# Patient Record
Sex: Female | Born: 1945 | Race: White | Hispanic: No | Smoking: Never smoker
Health system: Southern US, Community
[De-identification: ages and names within clinical notes are randomized; demographics above are authoritative.]

## PROBLEM LIST (undated history)

## (undated) DIAGNOSIS — I4719 Other supraventricular tachycardia: Secondary | ICD-10-CM

## (undated) DIAGNOSIS — E039 Hypothyroidism, unspecified: Secondary | ICD-10-CM

## (undated) DIAGNOSIS — M1990 Unspecified osteoarthritis, unspecified site: Secondary | ICD-10-CM

## (undated) DIAGNOSIS — I10 Essential (primary) hypertension: Secondary | ICD-10-CM

## (undated) DIAGNOSIS — R001 Bradycardia, unspecified: Secondary | ICD-10-CM

## (undated) DIAGNOSIS — E119 Type 2 diabetes mellitus without complications: Secondary | ICD-10-CM

## (undated) DIAGNOSIS — R002 Palpitations: Secondary | ICD-10-CM

## (undated) DIAGNOSIS — E785 Hyperlipidemia, unspecified: Secondary | ICD-10-CM

## (undated) DIAGNOSIS — R739 Hyperglycemia, unspecified: Secondary | ICD-10-CM

## (undated) DIAGNOSIS — R55 Syncope and collapse: Secondary | ICD-10-CM

## (undated) DIAGNOSIS — Z974 Presence of external hearing-aid: Secondary | ICD-10-CM

## (undated) DIAGNOSIS — M8580 Other specified disorders of bone density and structure, unspecified site: Secondary | ICD-10-CM

## (undated) HISTORY — DX: Hypothyroidism, unspecified: E03.9

## (undated) HISTORY — DX: Other specified disorders of bone density and structure, unspecified site: M85.80

## (undated) HISTORY — DX: Hyperlipidemia, unspecified: E78.5

## (undated) HISTORY — PX: MANDIBLE SURGERY: SHX707

## (undated) HISTORY — DX: Hyperglycemia, unspecified: R73.9

---

## 2004-02-25 ENCOUNTER — Ambulatory Visit (HOSPITAL_COMMUNITY): Admission: RE | Admit: 2004-02-25 | Discharge: 2004-02-25 | Payer: Self-pay | Admitting: Neurosurgery

## 2004-06-29 ENCOUNTER — Ambulatory Visit: Payer: Self-pay | Admitting: Internal Medicine

## 2005-02-02 ENCOUNTER — Ambulatory Visit: Payer: Self-pay | Admitting: Internal Medicine

## 2005-06-19 ENCOUNTER — Ambulatory Visit: Payer: Self-pay | Admitting: Internal Medicine

## 2005-10-09 ENCOUNTER — Ambulatory Visit: Payer: Self-pay | Admitting: Internal Medicine

## 2006-11-21 ENCOUNTER — Ambulatory Visit: Payer: Self-pay | Admitting: Internal Medicine

## 2007-12-02 ENCOUNTER — Ambulatory Visit: Payer: Self-pay | Admitting: Internal Medicine

## 2008-12-02 ENCOUNTER — Ambulatory Visit: Payer: Self-pay | Admitting: Internal Medicine

## 2009-09-20 DIAGNOSIS — D239 Other benign neoplasm of skin, unspecified: Secondary | ICD-10-CM

## 2009-09-20 HISTORY — DX: Other benign neoplasm of skin, unspecified: D23.9

## 2009-12-05 ENCOUNTER — Ambulatory Visit: Payer: Self-pay | Admitting: Internal Medicine

## 2011-02-22 ENCOUNTER — Ambulatory Visit: Payer: Self-pay | Admitting: Internal Medicine

## 2012-02-26 ENCOUNTER — Ambulatory Visit: Payer: Self-pay | Admitting: Internal Medicine

## 2013-03-17 ENCOUNTER — Ambulatory Visit: Payer: Self-pay | Admitting: Internal Medicine

## 2014-04-27 ENCOUNTER — Ambulatory Visit: Payer: Self-pay | Admitting: Internal Medicine

## 2015-06-14 ENCOUNTER — Other Ambulatory Visit: Payer: Self-pay | Admitting: Internal Medicine

## 2015-06-14 DIAGNOSIS — Z1231 Encounter for screening mammogram for malignant neoplasm of breast: Secondary | ICD-10-CM

## 2015-06-23 ENCOUNTER — Ambulatory Visit: Payer: Self-pay

## 2015-06-30 ENCOUNTER — Ambulatory Visit
Admission: RE | Admit: 2015-06-30 | Discharge: 2015-06-30 | Disposition: A | Discharge status: Home / Self Care | Payer: Medicare Other | Source: Ambulatory Visit | Attending: Internal Medicine | Admitting: Internal Medicine

## 2015-06-30 ENCOUNTER — Other Ambulatory Visit: Payer: Self-pay | Admitting: Internal Medicine

## 2015-06-30 DIAGNOSIS — Z1231 Encounter for screening mammogram for malignant neoplasm of breast: Secondary | ICD-10-CM

## 2015-12-05 ENCOUNTER — Ambulatory Visit
Admission: RE | Admit: 2015-12-05 | Discharge: 2015-12-05 | Disposition: A | Discharge status: Home / Self Care | Payer: Medicare Other | Source: Ambulatory Visit | Attending: Internal Medicine | Admitting: Internal Medicine

## 2015-12-05 ENCOUNTER — Other Ambulatory Visit: Payer: Self-pay | Admitting: Internal Medicine

## 2015-12-05 DIAGNOSIS — M19041 Primary osteoarthritis, right hand: Secondary | ICD-10-CM | POA: Insufficient documentation

## 2015-12-05 DIAGNOSIS — M79646 Pain in unspecified finger(s): Secondary | ICD-10-CM | POA: Insufficient documentation

## 2015-12-05 DIAGNOSIS — M79643 Pain in unspecified hand: Secondary | ICD-10-CM

## 2016-06-29 ENCOUNTER — Other Ambulatory Visit: Payer: Self-pay | Admitting: Internal Medicine

## 2016-06-29 DIAGNOSIS — E049 Nontoxic goiter, unspecified: Secondary | ICD-10-CM

## 2016-06-29 DIAGNOSIS — Z1239 Encounter for other screening for malignant neoplasm of breast: Secondary | ICD-10-CM

## 2016-07-04 ENCOUNTER — Ambulatory Visit
Admission: RE | Admit: 2016-07-04 | Discharge: 2016-07-04 | Disposition: A | Discharge status: Home / Self Care | Payer: Medicare Other | Source: Ambulatory Visit | Attending: Internal Medicine | Admitting: Internal Medicine

## 2016-07-04 ENCOUNTER — Telehealth: Payer: Self-pay | Admitting: Gastroenterology

## 2016-07-04 DIAGNOSIS — E049 Nontoxic goiter, unspecified: Secondary | ICD-10-CM | POA: Insufficient documentation

## 2016-07-04 NOTE — Telephone Encounter (Signed)
Please double check his secondary insurance [employer]. If he has it, please add.

## 2016-07-04 NOTE — Telephone Encounter (Signed)
colonoscopy

## 2016-07-05 ENCOUNTER — Other Ambulatory Visit: Payer: Self-pay

## 2016-07-05 NOTE — Telephone Encounter (Signed)
Gastroenterology Pre-Procedure Review  Request Date: 09/02/16 Requesting Physician: Dr. Rosario Jacks  PATIENT REVIEW QUESTIONS: The patient responded to the following health history questions as indicated:    1. Are you having any GI issues? no 2. Do you have a personal history of Polyps? no 3. Do you have a family history of Colon Cancer or Polyps? no 4. Diabetes Mellitus? no 5. Joint replacements in the past 12 months?no 6. Major health problems in the past 3 months?no 7. Any artificial heart valves, MVP, or defibrillator?no    MEDICATIONS & ALLERGIES:    Patient reports the following regarding taking any anticoagulation/antiplatelet therapy:   Plavix, Coumadin, Eliquis, Xarelto, Lovenox, Pradaxa, Brilinta, or Effient? no Aspirin? yes (Heart [facility])  Patient confirms/reports the following medications:  Current Outpatient Prescriptions  Medication Sig Dispense Refill  . aspirin (GOODSENSE ASPIRIN) 325 MG tablet Take by mouth.    Marland Kitchen atorvastatin (LIPITOR) 10 MG tablet Take by mouth.    . metFORMIN (GLUCOPHAGE) 1000 MG tablet Take by mouth.    . metoprolol succinate (TOPROL-XL) 25 MG 24 hr tablet Take by mouth.     No current facility-administered medications for this visit.     Patient confirms/reports the following allergies:  Allergies  Allergen Reactions  . Penicillins     No orders of the defined types were placed in this encounter.   AUTHORIZATION INFORMATION Primary Insurance: 1D#: Group #:  Secondary Insurance: 1D#: Group #:  SCHEDULE INFORMATION: Date: 08/23/2016 Time: Location: [facility]

## 2016-07-05 NOTE — Telephone Encounter (Signed)
Z12.11 Screening Colonoscopy  08/23/16 [facility] U[employer]/Medicare  Please pre cert

## 2016-07-11 NOTE — Telephone Encounter (Signed)
Authorization is not required per [facility]. At [phone] (redirected to another number)

## 2016-07-13 ENCOUNTER — Ambulatory Visit
Admission: RE | Admit: 2016-07-13 | Discharge: 2016-07-13 | Disposition: A | Discharge status: Home / Self Care | Payer: Medicare Other | Source: Ambulatory Visit | Attending: Internal Medicine | Admitting: Internal Medicine

## 2016-07-13 DIAGNOSIS — Z1239 Encounter for other screening for malignant neoplasm of breast: Secondary | ICD-10-CM

## 2016-07-13 DIAGNOSIS — N6320 Unspecified lump in the left breast, unspecified quadrant: Secondary | ICD-10-CM | POA: Insufficient documentation

## 2016-08-16 NOTE — Discharge Instructions (Signed)

## 2016-08-23 ENCOUNTER — Ambulatory Visit: Payer: Medicare Other | Admitting: Anesthesiology

## 2016-08-23 ENCOUNTER — Encounter
Admission: RE | Disposition: A | Discharge status: Home / Self Care | Payer: Self-pay | Source: Ambulatory Visit | Attending: Gastroenterology

## 2016-08-23 ENCOUNTER — Ambulatory Visit
Admission: RE | Admit: 2016-08-23 | Discharge: 2016-08-23 | Disposition: A | Discharge status: Home / Self Care | Payer: Medicare Other | Source: Ambulatory Visit | Attending: Gastroenterology | Admitting: Gastroenterology

## 2016-08-23 DIAGNOSIS — K5730 Diverticulosis of large intestine without perforation or abscess without bleeding: Secondary | ICD-10-CM | POA: Insufficient documentation

## 2016-08-23 DIAGNOSIS — Z7984 Long term (current) use of oral hypoglycemic drugs: Secondary | ICD-10-CM | POA: Insufficient documentation

## 2016-08-23 DIAGNOSIS — Z1211 Encounter for screening for malignant neoplasm of colon: Secondary | ICD-10-CM | POA: Diagnosis not present

## 2016-08-23 DIAGNOSIS — Z79899 Other long term (current) drug therapy: Secondary | ICD-10-CM | POA: Insufficient documentation

## 2016-08-23 DIAGNOSIS — D125 Benign neoplasm of sigmoid colon: Secondary | ICD-10-CM | POA: Diagnosis not present

## 2016-08-23 DIAGNOSIS — Z7982 Long term (current) use of aspirin: Secondary | ICD-10-CM | POA: Diagnosis not present

## 2016-08-23 DIAGNOSIS — I10 Essential (primary) hypertension: Secondary | ICD-10-CM | POA: Insufficient documentation

## 2016-08-23 DIAGNOSIS — E119 Type 2 diabetes mellitus without complications: Secondary | ICD-10-CM | POA: Diagnosis not present

## 2016-08-23 DIAGNOSIS — K635 Polyp of colon: Secondary | ICD-10-CM

## 2016-08-23 DIAGNOSIS — K641 Second degree hemorrhoids: Secondary | ICD-10-CM | POA: Insufficient documentation

## 2016-08-23 HISTORY — DX: Type 2 diabetes mellitus without complications: E11.9

## 2016-08-23 HISTORY — PX: POLYPECTOMY: SHX5525

## 2016-08-23 HISTORY — DX: Palpitations: R00.2

## 2016-08-23 HISTORY — DX: Unspecified osteoarthritis, unspecified site: M19.90

## 2016-08-23 HISTORY — PX: COLONOSCOPY WITH PROPOFOL: SHX5780

## 2016-08-23 HISTORY — DX: Essential (primary) hypertension: I10

## 2016-08-23 LAB — GLUCOSE, CAPILLARY: Glucose-Capillary: 108 mg/dL — ABNORMAL HIGH (ref 65–99)

## 2016-08-23 SURGERY — COLONOSCOPY WITH PROPOFOL
Anesthesia: Monitor Anesthesia Care | Site: Rectum | Wound class: Contaminated

## 2016-08-23 MED ORDER — LIDOCAINE HCL (CARDIAC) 20 MG/ML IV SOLN
INTRAVENOUS | Status: DC | PRN
  Administered 2016-08-23: 50 mg via INTRAVENOUS
  Administered 2016-08-23: 500 mg via INTRAVENOUS

## 2016-08-23 MED ORDER — STERILE WATER FOR IRRIGATION IR SOLN
Status: DC | PRN
  Administered 2016-08-23: 08:00:00

## 2016-08-23 MED ORDER — LACTATED RINGERS IV SOLN
INTRAVENOUS | Status: DC
  Administered 2016-08-23: 08:00:00 via INTRAVENOUS

## 2016-08-23 MED ORDER — PROPOFOL 10 MG/ML IV BOLUS
INTRAVENOUS | Status: DC | PRN
  Administered 2016-08-23: 20 mg via INTRAVENOUS
  Administered 2016-08-23: 100 mg via INTRAVENOUS
  Administered 2016-08-23 (×3): 20 mg via INTRAVENOUS
  Administered 2016-08-23: 30 mg via INTRAVENOUS
  Administered 2016-08-23: 20 mg via INTRAVENOUS

## 2016-08-23 SURGICAL SUPPLY — 23 items
CANISTER SUCT 1200ML W/VALVE (MISCELLANEOUS) ×3 IMPLANT
CLIP HMST 235XBRD CATH ROT (MISCELLANEOUS) IMPLANT
CLIP RESOLUTION 360 11X235 (MISCELLANEOUS)
FCP ESCP3.2XJMB 240X2.8X (MISCELLANEOUS)
FORCEPS BIOP RAD 4 LRG CAP 4 (CUTTING FORCEPS) IMPLANT
FORCEPS BIOP RJ4 240 W/NDL (MISCELLANEOUS)
FORCEPS ESCP3.2XJMB 240X2.8X (MISCELLANEOUS) IMPLANT
GOWN CVR UNV OPN BCK APRN NK (MISCELLANEOUS) ×2 IMPLANT
GOWN ISOL THUMB LOOP REG UNIV (MISCELLANEOUS) ×6
INJECTOR VARIJECT VIN23 (MISCELLANEOUS) IMPLANT
KIT DEFENDO VALVE AND CONN (KITS) IMPLANT
KIT ENDO PROCEDURE OLY (KITS) ×3 IMPLANT
MARKER SPOT ENDO TATTOO 5ML (MISCELLANEOUS) IMPLANT
PAD GROUND ADULT SPLIT (MISCELLANEOUS) IMPLANT
PROBE APC STR FIRE (PROBE) IMPLANT
RETRIEVER NET ROTH 2.5X230 LF (MISCELLANEOUS) IMPLANT
SNARE SHORT THROW 13M SML OVAL (MISCELLANEOUS) ×2 IMPLANT
SNARE SHORT THROW 30M LRG OVAL (MISCELLANEOUS) IMPLANT
SNARE SNG USE RND 15MM (INSTRUMENTS) IMPLANT
SPOT EX ENDOSCOPIC TATTOO (MISCELLANEOUS)
TRAP ETRAP POLY (MISCELLANEOUS) ×2 IMPLANT
VARIJECT INJECTOR VIN23 (MISCELLANEOUS)
WATER STERILE IRR 250ML POUR (IV SOLUTION) ×3 IMPLANT

## 2016-08-23 NOTE — Op Note (Signed)
[facility] Gastroenterology Patient Name: Francyne Kazan Procedure Date: 08/23/2016 7:56 AM MRN: [id number] Account #: [id number] Date of Birth: Oct 21, 1945 Admit Type: Outpatient Age: 70 Room: [facility] OR ROOM 01 Gender: Female Note Status: Finalized Procedure:            Colonoscopy Indications:          Screening for colorectal malignant neoplasm Providers:            Lucilla Lame MD, MD Referring MD:         Casilda Carls, MD (Referring MD) Medicines:            Propofol per Anesthesia Complications:        No immediate complications. Procedure:            Pre-Anesthesia Assessment:                       - Prior to the procedure, a History and Physical was                        performed, and patient medications and allergies were                        reviewed. The patient's tolerance of previous                        anesthesia was also reviewed. The risks and benefits of                        the procedure and the sedation options and risks were                        discussed with the patient. All questions were                        answered, and informed consent was obtained. Prior                        Anticoagulants: The patient has taken no previous                        anticoagulant or antiplatelet agents. ASA Grade                        Assessment: II - A patient with mild systemic disease.                        After reviewing the risks and benefits, the patient was                        deemed in satisfactory condition to undergo the                        procedure.                       After obtaining informed consent, the colonoscope was                        passed under direct vision. Throughout the procedure,  the patient's blood pressure, pulse, and oxygen                        saturations were monitored continuously. The Olympus                        CF-HQ190L Colonoscope (S#. [id number]) was introduced                  through the anus and advanced to the the cecum,                        identified by appendiceal orifice and ileocecal valve.                        The colonoscopy was performed without difficulty. The                        patient tolerated the procedure well. The quality of                        the bowel preparation was excellent. Findings:      The perianal and digital rectal examinations were normal.      A 8 mm polyp was found in the sigmoid colon. The polyp was pedunculated.       The polyp was removed with a cold snare. Resection and retrieval were       complete.      Multiple small-mouthed diverticula were found in the sigmoid colon.      Non-bleeding internal hemorrhoids were found during retroflexion. The       hemorrhoids were Grade II (internal hemorrhoids that prolapse but reduce       spontaneously). Impression:           - One 8 mm polyp in the sigmoid colon, removed with a                        cold snare. Resected and retrieved.                       - Diverticulosis in the sigmoid colon.                       - Non-bleeding internal hemorrhoids. Recommendation:       - Discharge patient to home.                       - Resume previous diet.                       - Continue present medications.                       - Await pathology results.                       - Repeat colonoscopy in 5 years if polyp adenoma and 10                        years if hyperplastic Procedure Code(s):    --- Professional ---  45385, Colonoscopy, flexible; with removal of tumor(s),                        polyp(s), or other lesion(s) by snare technique Diagnosis Code(s):    --- Professional ---                       Z12.11, Encounter for screening for malignant neoplasm                        of colon                       D12.5, Benign neoplasm of sigmoid colon CPT copyright 2016 American Medical Association. All rights reserved. The codes  documented in this report are preliminary and upon coder review may  be revised to meet current compliance requirements. Lucilla Lame MD, MD 08/23/2016 8:20:39 AM This report has been signed electronically. Number of Addenda: 0 Note Initiated On: 08/23/2016 7:56 AM Scope Withdrawal Time: 0 hours 9 minutes 58 seconds  Total Procedure Duration: 0 hours 13 minutes 32 seconds       [facility]

## 2016-08-23 NOTE — Anesthesia Procedure Notes (Addendum)
Procedure Name: MAC Date/Time: 08/23/2016 8:01 AM Performed by: Cameron Ali Pre-anesthesia Checklist: Patient identified, Emergency Drugs available, Suction available, Timeout performed and Patient being monitored Patient Re-evaluated:Patient Re-evaluated prior to inductionOxygen Delivery Method: Nasal cannula Placement Confirmation: positive ETCO2

## 2016-08-23 NOTE — Anesthesia Postprocedure Evaluation (Signed)
Anesthesia Post Note  Patient: Gus Rankin  Procedure(s) Performed: Procedure(s) (LRB): COLONOSCOPY WITH PROPOFOL (N/A) POLYPECTOMY (N/A)  Patient location during evaluation: PACU Anesthesia Type: MAC Level of consciousness: awake and alert and oriented Pain management: satisfactory to patient Vital Signs Assessment: post-procedure vital signs reviewed and stable Respiratory status: spontaneous breathing, nonlabored ventilation and respiratory function stable Cardiovascular status: blood pressure returned to baseline and stable Postop Assessment: Adequate PO intake and No signs of nausea or vomiting Anesthetic complications: no    Raliegh Ip

## 2016-08-23 NOTE — Anesthesia Preprocedure Evaluation (Signed)
Anesthesia Evaluation  Patient identified by MRN, date of birth, ID band Patient awake    Reviewed: Allergy & Precautions, H&P , NPO status , Patient's Chart, lab work & pertinent test results  Airway Mallampati: II  TM Distance: >3 FB Neck ROM: full    Dental no notable dental hx.    Pulmonary    Pulmonary exam normal        Cardiovascular hypertension, Normal cardiovascular exam     Neuro/Psych    GI/Hepatic   Endo/Other  diabetes  Renal/GU      Musculoskeletal   Abdominal   Peds  Hematology   Anesthesia Other Findings   Reproductive/Obstetrics                             Anesthesia Physical Anesthesia Plan  ASA: II  Anesthesia Plan: MAC   Post-op Pain Management:    Induction:   Airway Management Planned:   Additional Equipment:   Intra-op Plan:   Post-operative Plan:   Informed Consent: I have reviewed the patients History and Physical, chart, labs and discussed the procedure including the risks, benefits and alternatives for the proposed anesthesia with the patient or authorized representative who has indicated his/her understanding and acceptance.     Plan Discussed with:   Anesthesia Plan Comments:         Anesthesia Quick Evaluation  

## 2016-08-23 NOTE — Transfer of Care (Signed)
Immediate Anesthesia Transfer of Care Note  Patient: Whitney Bowers  Procedure(s) Performed: Procedure(s) with comments: COLONOSCOPY WITH PROPOFOL (N/A) - diabetic  POLYPECTOMY (N/A)  Patient Location: PACU  Anesthesia Type: MAC  Level of Consciousness: awake, alert  and patient cooperative  Airway and Oxygen Therapy: Patient Spontanous Breathing and Patient connected to supplemental oxygen  Post-op Assessment: Post-op Vital signs reviewed, Patient's Cardiovascular Status Stable, Respiratory Function Stable, Patent Airway and No signs of Nausea or vomiting  Post-op Vital Signs: Reviewed and stable  Complications: No apparent anesthesia complications

## 2016-08-23 NOTE — H&P (Signed)
  Lucilla Lame, MD [facility] [address] [place], [place] [address] Phone: [phone] Fax : [phone]  Primary Care Physician:  Casilda Carls Primary Gastroenterologist:  Dr. Allen Norris  Pre-Procedure History & Physical: HPI:  Whitney Bowers is a 70 y.o. female is here for a screening colonoscopy.   Past Medical History:  Diagnosis Date  . Arthritis   . Diabetes mellitus without complication ([place])   . Hypertension   . Palpitations     Past Surgical History:  Procedure Laterality Date  . CESAREAN SECTION    . MANDIBLE SURGERY      Prior to Admission medications   Medication Sig Start Date End Date Taking? Authorizing Provider  metFORMIN (GLUCOPHAGE) 1000 MG tablet Take by mouth.   Yes Historical Provider, MD  metoprolol succinate (TOPROL-XL) 25 MG 24 hr tablet Take by mouth.   Yes Historical Provider, MD  aspirin (GOODSENSE ASPIRIN) 325 MG tablet Take by mouth.    Historical Provider, MD  atorvastatin (LIPITOR) 10 MG tablet Take by mouth.    Historical Provider, MD    Allergies as of 07/05/2016 - Review Complete 07/05/2016  Allergen Reaction Noted  . Penicillins  02/16/2016    Family History  Problem Relation Age of Onset  . Breast cancer Maternal Aunt 60    Social History   Social History  . Marital status: Widowed    Spouse name: N/A  . Number of children: N/A  . Years of education: N/A   Occupational History  . Not on file.   Social History Main Topics  . Smoking status: Never Smoker  . Smokeless tobacco: Never Used  . Alcohol use 4.2 oz/week    7 Glasses of wine per week  . Drug use: No  . Sexual activity: Not on file   Other Topics Concern  . Not on file   Social History Narrative  . No narrative on file    Review of Systems: See HPI, otherwise negative ROS  Physical Exam: BP 127/81   Pulse (!) 57   Temp 98 F (36.7 C) (Tympanic)   Resp 16   Ht 5\' 8"  (1.727 m)   Wt 137 lb (62.1 kg)   SpO2 99%   BMI 20.83 kg/m  General:    Alert,  pleasant and cooperative in NAD Head:  Normocephalic and atraumatic. Neck:  Supple; no masses or thyromegaly. Lungs:  Clear throughout to auscultation.    Heart:  Regular rate and rhythm. Abdomen:  Soft, nontender and nondistended. Normal bowel sounds, without guarding, and without rebound.   Neurologic:  Alert and  oriented x4;  grossly normal neurologically.  Impression/Plan: Whitney Bowers is now here to undergo a screening colonoscopy.  Risks, benefits, and alternatives regarding colonoscopy have been reviewed with the patient.  Questions have been answered.  All parties agreeable.

## 2016-08-24 ENCOUNTER — Encounter: Payer: Self-pay | Admitting: Gastroenterology

## 2016-08-28 ENCOUNTER — Encounter: Payer: Self-pay | Admitting: Gastroenterology

## 2016-08-30 ENCOUNTER — Encounter: Payer: Self-pay | Admitting: Gastroenterology

## 2017-08-02 ENCOUNTER — Other Ambulatory Visit: Payer: Self-pay | Admitting: Internal Medicine

## 2017-08-02 DIAGNOSIS — Z1231 Encounter for screening mammogram for malignant neoplasm of breast: Secondary | ICD-10-CM

## 2017-08-22 ENCOUNTER — Ambulatory Visit
Admission: RE | Admit: 2017-08-22 | Discharge: 2017-08-22 | Disposition: A | Discharge status: Home / Self Care | Payer: Medicare Other | Source: Ambulatory Visit | Attending: Internal Medicine | Admitting: Internal Medicine

## 2017-08-22 ENCOUNTER — Other Ambulatory Visit: Payer: Self-pay | Admitting: Internal Medicine

## 2017-08-22 DIAGNOSIS — Z1231 Encounter for screening mammogram for malignant neoplasm of breast: Secondary | ICD-10-CM

## 2018-07-09 ENCOUNTER — Telehealth: Payer: Self-pay | Admitting: Internal Medicine

## 2018-07-09 ENCOUNTER — Telehealth: Payer: Self-pay | Admitting: *Deleted

## 2018-07-09 NOTE — Telephone Encounter (Signed)
Dr. Guerry Bruin office calling States that lab information requested should be sent to Dr. Laurelyn Sickle office at [facility]

## 2018-07-09 NOTE — Telephone Encounter (Signed)
I contacted pt to verify testing and where pt had cardiac testing done.  Pt mentioned that she went through [place] to have all testing done. He ordered the testing and Humphrey Rolls performed but pt never saw Humphrey Rolls in his office.  Pt said "I wore an event monitor for 1 month and it was one of the worse month's I ever had because I had many episodes" She said "Dr.Khan's office only reported one episode to Dr. Guerry Bruin office and she is aware that she had several episodes" Pt explained that there was poor communication between offices and her results. Pt was told she was following up with Dr. Humphrey Rolls and she told Dr. Rosario Jacks she was not going to see Humphrey Rolls. She was scheduled to see Dr. Caryl Comes for EP.  Pt had a stress test done at Dr. Laurelyn Sickle office and event monitor ordered and received by mail.   Pt couldn't remember the name of monitor she wore but knows she wore one. Pt mentioned she called [facility] office to figure out what was going on and she mentioned that she is fine with being rescheduled if records are not obtain by her visit tomorrow.  Pt was highly upset with Dr. Guerry Bruin office for not being able to provide results for her visit and mentioned that she had a lot of concerns concerning her results and poor communication with Khan's office.   Ivin Booty contacted Dr. Guerry Bruin office to request records and spoke directly to [facility] and he mentioned to obtain records from [place] office and that he wasn't the one who referred the pt to our office and that Humphrey Rolls was responsible for providing [place] with records.

## 2018-07-10 ENCOUNTER — Other Ambulatory Visit: Payer: Self-pay | Admitting: *Deleted

## 2018-07-10 ENCOUNTER — Ambulatory Visit (INDEPENDENT_AMBULATORY_CARE_PROVIDER_SITE_OTHER): Payer: Medicare Other

## 2018-07-10 ENCOUNTER — Encounter: Payer: Self-pay | Admitting: *Deleted

## 2018-07-10 ENCOUNTER — Ambulatory Visit: Payer: Medicare Other | Admitting: Internal Medicine

## 2018-07-10 VITALS — BP 116/73 | HR 55 | Ht 68.0 in | Wt 140.2 lb

## 2018-07-10 DIAGNOSIS — R002 Palpitations: Secondary | ICD-10-CM

## 2018-07-10 NOTE — Progress Notes (Signed)
ELECTROPHYSIOLOGY CONSULT NOTE  Patient ID: Whitney Bowers, MRN: [id number], DOB/AGE: Dec 25, 1945 72 y.o. Admit date: (Not on file) Date of Consult: 07/10/2018  Primary Physician: Casilda Carls, MD     Whitney Bowers is a 72 y.o. female who is being seen today for the evaluation of spells at the request of Dr Rosario Jacks.    HPI Whitney Bowers is a 73 y.o. female with a 3 yr hx of abrupt onset offset tachypalpitations associated with presyncope and on one occasion syncope.  Duration is seconds.  No prodrome. Recovery symptoms are really relatively brief.  She was able to stand up off the floor after just about 30 seconds.  These episodes occur frequently.  Often multiple times a day.  She was given an event recorder earlier this summer during which she had multiple episodes.  No activation recordings were made; there was one episode of a nonsustained atrial tachycardia..  Quite fit.  Working out in [employer] and doing Pilates.  Symptoms not correlated with specific activities.  Has eliminated caffeine without benefit.  Acknowledges alcohol 1-2 glasses of wine per day Past Medical History:  Diagnosis Date  . Arthritis   . Diabetes mellitus without complication ([place])   . Hyperglycemia   . Hyperlipidemia   . Hypertension   . Hypothyroidism   . Osteopenia   . Palpitations       Surgical History:  Past Surgical History:  Procedure Laterality Date  . CESAREAN SECTION    . COLONOSCOPY WITH PROPOFOL N/A 08/23/2016   Procedure: COLONOSCOPY WITH PROPOFOL;  Surgeon: Lucilla Lame, MD;  Location: [place];  Service: Endoscopy;  Laterality: N/A;  diabetic   . MANDIBLE SURGERY    . POLYPECTOMY N/A 08/23/2016   Procedure: POLYPECTOMY;  Surgeon: Lucilla Lame, MD;  Location: [place];  Service: Endoscopy;  Laterality: N/A;     Home Meds: Current Meds  Medication Sig  . aspirin (GOODSENSE ASPIRIN) 325 MG tablet Take 325 mg by mouth daily.   Marland Kitchen atorvastatin (LIPITOR) 10  MG tablet Take 10 mg by mouth daily.   Marland Kitchen levothyroxine (SYNTHROID, LEVOTHROID) 50 MCG tablet Take 50 mcg by mouth daily.   . metFORMIN (GLUCOPHAGE) 500 MG tablet Takes 2 tablets am and 1 tablet pm daily.  . metoprolol tartrate (LOPRESSOR) 25 MG tablet Take 25 mg by mouth 2 (two) times daily.    Allergies:  Allergies  Allergen Reactions  . Penicillins     Social History   Socioeconomic History  . Marital status: Widowed    Spouse name: Not on file  . Number of children: Not on file  . Years of education: Not on file  . Highest education level: Not on file  Occupational History  . Not on file  Social Needs  . Financial resource strain: Not on file  . Food insecurity:    Worry: Not on file    Inability: Not on file  . Transportation needs:    Medical: Not on file    Non-medical: Not on file  Tobacco Use  . Smoking status: Never Smoker  . Smokeless tobacco: Never Used  Substance and Sexual Activity  . Alcohol use: Yes    Alcohol/week: 7.0 standard drinks    Types: 7 Glasses of wine per week    Comment: wine  . Drug use: No  . Sexual activity: Not on file  Lifestyle  . Physical activity:    Days per week: Not on file  Minutes per session: Not on file  . Stress: Not on file  Relationships  . Social connections:    Talks on phone: Not on file    Gets together: Not on file    Attends religious service: Not on file    Active member of club or organization: Not on file    Attends meetings of clubs or organizations: Not on file    Relationship status: Not on file  . Intimate partner violence:    Fear of current or ex partner: Not on file    Emotionally abused: Not on file    Physically abused: Not on file    Forced sexual activity: Not on file  Other Topics Concern  . Not on file  Social History Narrative  . Not on file     Family History  Problem Relation Age of Onset  . Breast cancer Maternal Aunt 60  . Hypertension Father   . Stroke Father      ROS:   Please see the history of present illness.     All other systems reviewed and negative.    Physical Exam: Blood pressure 116/73, pulse (!) 55, height 5\' 8"  (1.727 m), weight 140 lb 4 oz (63.6 kg). General: Well developed, well nourished female in no acute distress. Head: Normocephalic, atraumatic, sclera non-icteric, no xanthomas, nares are without discharge. EENT: normal  Lymph Nodes:  none Neck: Negative for carotid bruits. JVD not elevated. Back:without scoliosis kyphosis Lungs: Clear bilaterally to auscultation without wheezes, rales, or rhonchi. Breathing is unlabored. Heart: RRR with S1 S2. No  murmur . No rubs, or gallops appreciated. Abdomen: Soft, non-tender, non-distended with normoactive bowel sounds. No hepatomegaly. No rebound/guarding. No obvious abdominal masses. Msk:  Strength and tone appear normal for age. Extremities: No clubbing or cyanosis. No edema.  Distal pedal pulses are 2+ and equal bilaterally. Skin: Warm and Dry Neuro: Alert and oriented X 3. CN III-XII intact Grossly normal sensory and motor function . Psych:  Responds to questions appropriately with a normal affect.      Labs: Cardiac Enzymes No results for input(s): CKTOTAL, CKMB, TROPONINI in the last 72 hours. CBC No results found for: WBC, HGB, HCT, MCV, PLT PROTIME: No results for input(s): LABPROT, INR in the last 72 hours. Chemistry No results for input(s): NA, K, CL, CO2, BUN, CREATININE, CALCIUM, PROT, BILITOT, ALKPHOS, ALT, AST, GLUCOSE in the last 168 hours.  Invalid input(s): LABALBU Lipids No results found for: CHOL, HDL, LDLCALC, TRIG BNP No results found for: PROBNP Thyroid Function Tests: No results for input(s): TSH, T4TOTAL, T3FREE, THYROIDAB in the last 72 hours.  Invalid input(s): FREET3 Miscellaneous No results found for: DDIMER  Radiology/Studies:  No results found.  EKG: Sinus at 55% intervals 23/10/41   Assessment and Plan:  Presyncope  Atrial  tachycardia-nonsustained  Sinus bradycardia with first-degree AV block   The patient has had presyncopal episodes.  The onset.  They are associated with palpitations.  Interestingly, the event recorder that she had earlier this summer recorded only one episode of nonsustained atrial tachycardia.  This raises significant questions as to the mechanism of her palpitations.  We have discussed the use of a ZIO patch.  This would allow for patient activation to try to identify an arrhythmic trigger.  If in fact we saw atrial tachycardia flecainide might be an appropriate therapeutic intervention.  More than 50% of 60  min was spent in counseling related to the above   Virl Axe

## 2018-07-10 NOTE — Telephone Encounter (Signed)
Notes received

## 2018-07-10 NOTE — Patient Instructions (Signed)
Medication Instructions:  - Your physician recommends that you continue on your current medications as directed. Please refer to the Current Medication list given to you today.  If you need a refill on your cardiac medications before your next appointment, please call your pharmacy.   Lab work: - none ordered  If you have labs (blood work) drawn today and your tests are completely normal, you will receive your results only by: Marland Kitchen MyChart Message (if you have MyChart) OR . A paper copy in the mail If you have any lab test that is abnormal or we need to change your treatment, we will call you to review the results.  Testing/Procedures: - Your physician has recommended that you wear a 14 day heart monitor (ZIO patch).  Follow-Up: At [facility], you and your health needs are our priority.  As part of our continuing mission to provide you with exceptional heart care, we have created designated Provider Care Teams.  These Care Teams include your primary Cardiologist (physician) and Advanced Practice Providers (APPs -  Physician Assistants and Nurse Practitioners) who all work together to provide you with the care you need, when you need it. . in 3-4 weeks with Dr. Caryl Comes  Any Other Special Instructions Will Be Listed Below (If Applicable). - N/A

## 2018-07-24 DIAGNOSIS — R002 Palpitations: Secondary | ICD-10-CM | POA: Diagnosis not present

## 2018-07-31 ENCOUNTER — Ambulatory Visit: Payer: Medicare Other | Admitting: Internal Medicine

## 2018-07-31 ENCOUNTER — Encounter: Payer: Self-pay | Admitting: Internal Medicine

## 2018-07-31 VITALS — BP 130/66 | HR 58 | Ht 68.0 in | Wt 142.0 lb

## 2018-07-31 NOTE — Progress Notes (Signed)
      Patient Care Team: Casilda Carls, MD as PCP - General (Internal Medicine)   HPI  Whitney Bowers is a 72 y.o. female Seen in follow-up for abrupt onset offset tachypalpitations associated with presyncope.  These have been going on for 3 years.  An event recorder had suggested atrial tachycardia.  No associated symptoms.  A ZIO Patch was prescribed.  Results were not available so the patient was not billed   She had a dozen episodes of her typical abrupt onset offset events.     Past Medical History:  Diagnosis Date  . Arthritis   . Diabetes mellitus without complication ([place])   . Hyperglycemia   . Hyperlipidemia   . Hypertension   . Hypothyroidism   . Osteopenia   . Palpitations     Past Surgical History:  Procedure Laterality Date  . CESAREAN SECTION    . COLONOSCOPY WITH PROPOFOL N/A 08/23/2016   Procedure: COLONOSCOPY WITH PROPOFOL;  Surgeon: Lucilla Lame, MD;  Location: [place];  Service: Endoscopy;  Laterality: N/A;  diabetic   . MANDIBLE SURGERY    . POLYPECTOMY N/A 08/23/2016   Procedure: POLYPECTOMY;  Surgeon: Lucilla Lame, MD;  Location: [place];  Service: Endoscopy;  Laterality: N/A;    Current Meds  Medication Sig  . aspirin (GOODSENSE ASPIRIN) 325 MG tablet Take 325 mg by mouth daily.   Marland Kitchen atorvastatin (LIPITOR) 10 MG tablet Take 10 mg by mouth daily.   Marland Kitchen levothyroxine (SYNTHROID, LEVOTHROID) 50 MCG tablet Take 50 mcg by mouth daily.   . metFORMIN (GLUCOPHAGE) 500 MG tablet Takes 2 tablets am and 1 tablet pm daily.  . metoprolol tartrate (LOPRESSOR) 25 MG tablet Take 25 mg by mouth 2 (two) times daily.    Allergies  Allergen Reactions  . Penicillins       Review of Systems negative except from HPI and PMH  Physical Exam BP 130/66 (BP Location: Left Arm, Patient Position: Sitting, Cuff Size: Normal)   Pulse (!) 58   Ht 5\' 8"  (1.727 m)   Wt 142 lb (64.4 kg)   BMI 21.59 kg/m  Well developed and nourished in no acute  distress HENT normal Neck supple with JVP-flat Clear Regular rate and rhythm, no murmurs or gallops Abd-soft with active BS No Clubbing cyanosis edema Skin-warm and dry A & Oriented  Grossly normal sensory and motor function   Assessment and  Plan  Presyncope  Atrial tachycardia-nonsustained  Sinus bradycardia with first-degree AV block  As above  not billed     Current medicines are reviewed at length with the patient today .  The patient does not  have concerns regarding medicines.

## 2018-08-07 ENCOUNTER — Telehealth: Payer: Self-pay | Admitting: Internal Medicine

## 2018-08-07 DIAGNOSIS — Z79899 Other long term (current) drug therapy: Secondary | ICD-10-CM

## 2018-08-07 DIAGNOSIS — I471 Supraventricular tachycardia: Secondary | ICD-10-CM

## 2018-08-07 MED ORDER — FLECAINIDE ACETATE 50 MG PO TABS: 50.0000 mg | ORAL_TABLET | Freq: Two times a day (BID) | ORAL | 1 refills | Status: DC

## 2018-08-07 NOTE — Telephone Encounter (Signed)
Deboraha Sprang, MD 08/02/2018 Routine    Narrative & Impression    Indication: palpitations  Duration: 2 wweks  Findings Recurrent tachycardia noted with symptoms   most consistent with atrial tach  rec low dose flecainide 50 mg bid  If recurrent tachycardia on 50 bid, increase to 75>>100 bid ( if necessary)  She has frequent spells, every few days, so within a week we should know if she is better  Will need GXT following stabliziation

## 2018-08-07 NOTE — Telephone Encounter (Signed)
I spoke with the patient regarding her results. She is aware of Dr. Olin Pia recommendations to:  1) Start flecainide 50 mg BID 2) Have a GXT for flecainide  In 10-14 days   I have reviewed with the patient that she should notice within a week that she feels better on the medication, however, if she does not, she is aware she may increase flecainide to 75 mg BID and see how she tolerates this dose.   The patient voices understanding of the above and is agreeable. She is aware I will have scheduling call her to arrange for a GXT to done on about 10-14 days on a day Dr. Caryl Comes is here in the office to observe.

## 2018-09-03 ENCOUNTER — Telehealth: Payer: Self-pay | Admitting: Internal Medicine

## 2018-09-03 NOTE — Telephone Encounter (Signed)
Call to patient for reminder of treadmill stress test tomorrow, 09/04/18 @ 11 am. Reminded pt of protocol for ETT. Pt verbalized understanding. All questions were answered.

## 2018-09-04 ENCOUNTER — Ambulatory Visit (INDEPENDENT_AMBULATORY_CARE_PROVIDER_SITE_OTHER): Payer: Medicare Other

## 2018-09-04 DIAGNOSIS — I471 Supraventricular tachycardia: Secondary | ICD-10-CM

## 2018-09-04 DIAGNOSIS — Z79899 Other long term (current) drug therapy: Secondary | ICD-10-CM

## 2018-09-05 LAB — EXERCISE TOLERANCE TEST
CHL CUP MPHR: 150 {beats}/min
CHL CUP RESTING HR STRESS: 59 {beats}/min
CSEPED: 4 min
CSEPHR: 91 %
Estimated workload: 10.7 METS
Exercise duration (sec): 3 s
Peak HR: 137 {beats}/min

## 2019-02-03 ENCOUNTER — Other Ambulatory Visit: Payer: Self-pay | Admitting: Internal Medicine

## 2019-02-19 ENCOUNTER — Other Ambulatory Visit: Payer: Self-pay | Admitting: Internal Medicine

## 2019-02-19 DIAGNOSIS — Z1231 Encounter for screening mammogram for malignant neoplasm of breast: Secondary | ICD-10-CM

## 2019-04-08 ENCOUNTER — Ambulatory Visit
Admission: RE | Admit: 2019-04-08 | Discharge: 2019-04-08 | Disposition: A | Discharge status: Home / Self Care | Payer: Medicare Other | Source: Ambulatory Visit | Attending: Internal Medicine | Admitting: Internal Medicine

## 2019-04-08 DIAGNOSIS — Z1231 Encounter for screening mammogram for malignant neoplasm of breast: Secondary | ICD-10-CM | POA: Insufficient documentation

## 2019-05-11 ENCOUNTER — Other Ambulatory Visit: Payer: Self-pay | Admitting: Internal Medicine

## 2019-05-12 ENCOUNTER — Telehealth: Payer: Self-pay

## 2019-05-12 MED ORDER — FLECAINIDE ACETATE 50 MG PO TABS: 50.0000 mg | ORAL_TABLET | Freq: Two times a day (BID) | ORAL | 0 refills | Status: DC

## 2019-05-12 NOTE — Telephone Encounter (Signed)
Refill sent for Flecainide 50 mg take one tablet twice a day.

## 2019-06-11 ENCOUNTER — Ambulatory Visit: Payer: Medicare Other | Admitting: Internal Medicine

## 2019-06-25 ENCOUNTER — Encounter: Payer: Self-pay | Admitting: Internal Medicine

## 2019-06-25 ENCOUNTER — Ambulatory Visit (INDEPENDENT_AMBULATORY_CARE_PROVIDER_SITE_OTHER): Payer: Medicare Other | Admitting: Internal Medicine

## 2019-06-25 ENCOUNTER — Other Ambulatory Visit: Payer: Self-pay

## 2019-06-25 VITALS — BP 110/80 | HR 57 | Temp 97.7°F | Ht 68.0 in | Wt 146.8 lb

## 2019-06-25 DIAGNOSIS — R001 Bradycardia, unspecified: Secondary | ICD-10-CM | POA: Diagnosis not present

## 2019-06-25 DIAGNOSIS — I471 Supraventricular tachycardia: Secondary | ICD-10-CM

## 2019-06-25 NOTE — Progress Notes (Signed)
      Patient Care Team: Casilda Carls, MD as PCP - General (Internal Medicine)   HPI  Whitney Bowers is a 73 y.o. female Seen in follow-up for abrupt onset offset tachypalpitations associated with presyncope.  ZIO patch monitor demonstrated atrial tachycardia.  She was started on low-dose flecainide. \ She has noted exceedingly well on the flecainide until just recently.  She has occasional palpitations.  Noticed mostly in the morning about 30 minutes after taking her medications.  No extrinsic stresses no changes in diet  Exercising without limitation, no chest pain shortness of breath peripheral edema nocturnal dyspnea orthopnea  DATE PR interval QRSduration Dose-Flecainide  10/19  234 93 0  10/20 248 96 50           Past Medical History:  Diagnosis Date  . Arthritis   . Diabetes mellitus without complication ([place])   . Hyperglycemia   . Hyperlipidemia   . Hypertension   . Hypothyroidism   . Osteopenia   . Palpitations     Past Surgical History:  Procedure Laterality Date  . CESAREAN SECTION    . COLONOSCOPY WITH PROPOFOL N/A 08/23/2016   Procedure: COLONOSCOPY WITH PROPOFOL;  Surgeon: Lucilla Lame, MD;  Location: [place];  Service: Endoscopy;  Laterality: N/A;  diabetic   . MANDIBLE SURGERY    . POLYPECTOMY N/A 08/23/2016   Procedure: POLYPECTOMY;  Surgeon: Lucilla Lame, MD;  Location: [place];  Service: Endoscopy;  Laterality: N/A;    Current Meds  Medication Sig  . atorvastatin (LIPITOR) 10 MG tablet Take 10 mg by mouth daily.   . flecainide (TAMBOCOR) 50 MG tablet Take 1 tablet (50 mg total) by mouth 2 (two) times daily.  Marland Kitchen levothyroxine (SYNTHROID, LEVOTHROID) 50 MCG tablet Take 50 mcg by mouth daily.   . metFORMIN (GLUCOPHAGE) 500 MG tablet Takes 2 tablets am and 1 tablet pm daily.  . metoprolol tartrate (LOPRESSOR) 25 MG tablet Take 25 mg by mouth 2 (two) times daily.    Allergies  Allergen Reactions  . Penicillins        Review of Systems negative except from HPI and PMH  Physical Exam BP 110/80 (BP Location: Left Arm, Patient Position: Sitting, Cuff Size: Normal)   Pulse (!) 57   Temp 97.7 F (36.5 C)   Ht 5\' 8"  (1.727 m)   Wt 146 lb 12 oz (66.6 kg)   SpO2 96%   BMI 22.31 kg/m  Well developed and nourished in no acute distress HENT normal Neck supple with JVP-  flat   Carotids brisk without bruits Clear Regular rate and rhythm, no murmurs or gallops Abd-soft with active BS No Clubbing cyanosis edema Skin-warm and dry A & Oriented  Grossly normal sensory and motor function  ECG As above    Assessment and  Plan  Presyncope  Atrial tachycardia-nonsustained  Sinus bradycardia with first-degree AV block  First-degree AV block no worse on the flecainide.  No interval lightheadedness.  Atrial tachycardia palpitations largely quiescient.  Suggested she try taking her flecainide a little bit earlier in the morning   Current medicines are reviewed at length with the patient today .  The patient does not  have concerns regarding medicines.

## 2019-06-25 NOTE — Patient Instructions (Signed)
Medication Instructions:  - Your physician recommends that you continue on your current medications as directed. Please refer to the Current Medication list given to you today.  If you need a refill on your cardiac medications before your next appointment, please call your pharmacy.   Lab work: - none ordered  If you have labs (blood work) drawn today and your tests are completely normal, you will receive your results only by: Marland Kitchen MyChart Message (if you have MyChart) OR . A paper copy in the mail If you have any lab test that is abnormal or we need to change your treatment, we will call you to review the results.  Testing/Procedures: - none ordered  Follow-Up: At [facility], you and your health needs are our priority.  As part of our continuing mission to provide you with exceptional heart care, we have created designated Provider Care Teams.  These Care Teams include your primary Cardiologist (physician) and Advanced Practice Providers (APPs -  Physician Assistants and Nurse Practitioners) who all work together to provide you with the care you need, when you need it.  You will need a follow up appointment in 12 months (October 2021).   . Please call our office 2 months in advance to schedule this appointment.  (Call in early August 2021 to schedule)  Any Other Special Instructions Will Be Listed Below (If Applicable). - N/A

## 2019-08-07 ENCOUNTER — Other Ambulatory Visit: Payer: Self-pay | Admitting: Internal Medicine

## 2019-11-09 ENCOUNTER — Ambulatory Visit: Payer: Medicare PPO | Attending: Internal Medicine

## 2019-11-09 DIAGNOSIS — Z23 Encounter for immunization: Secondary | ICD-10-CM | POA: Insufficient documentation

## 2019-11-09 NOTE — Progress Notes (Signed)
   Covid-19 Vaccination Clinic  Name:  LUJEAN DEFINO    MRN: [id number] DOB: 09/08/1946  11/09/2019  Ms. Depascale was observed post Covid-19 immunization for 15 minutes without incidence. She was provided with Vaccine Information Sheet and instruction to access the V-Safe system.   Ms. Pilger was instructed to call 911 with any severe reactions post vaccine: Marland Kitchen Difficulty breathing  . Swelling of your face and throat  . A fast heartbeat  . A bad rash all over your body  . Dizziness and weakness    Immunizations Administered    Name Date Dose VIS Date Route   Moderna COVID-19 Vaccine 11/09/2019  1:10 PM 0.5 mL 08/18/2019 Intramuscular   Manufacturer: Levan Hurst   Lot: [id number]   NDC: [id number]   Moderna COVID-19 Vaccine 11/09/2019  1:17 PM 0.5 mL 08/18/2019 Intramuscular   Manufacturer: Moderna   Lot: [id number]   EdwardsPO:[id number]

## 2019-12-08 ENCOUNTER — Ambulatory Visit: Payer: Medicare PPO | Attending: Internal Medicine

## 2019-12-08 DIAGNOSIS — Z23 Encounter for immunization: Secondary | ICD-10-CM

## 2019-12-08 NOTE — Progress Notes (Signed)
   Covid-19 Vaccination Clinic  Name:  Whitney Bowers    MRN: [id number] DOB: October 12, 1945  12/08/2019  [name] was observed post Covid-19 immunization for 15 minutes without incident. She was provided with Vaccine Information Sheet and instruction to access the V-Safe system.   [name] was instructed to call 911 with any severe reactions post vaccine: Marland Kitchen Difficulty breathing  . Swelling of face and throat  . A fast heartbeat  . A bad rash all over body  . Dizziness and weakness   Immunizations Administered    Name Date Dose VIS Date Route   Moderna COVID-19 Vaccine 12/08/2019 12:55 PM 0.5 mL 08/18/2019 Intramuscular   Manufacturer: Levan Hurst   LotKK:[id number]   BakerVO:[id number]

## 2020-05-10 ENCOUNTER — Other Ambulatory Visit: Payer: Self-pay | Admitting: Internal Medicine

## 2020-05-10 DIAGNOSIS — Z1231 Encounter for screening mammogram for malignant neoplasm of breast: Secondary | ICD-10-CM

## 2020-05-27 ENCOUNTER — Ambulatory Visit
Admission: RE | Admit: 2020-05-27 | Discharge: 2020-05-27 | Disposition: A | Discharge status: Home / Self Care | Payer: Medicare PPO | Source: Ambulatory Visit | Attending: Internal Medicine | Admitting: Internal Medicine

## 2020-05-27 DIAGNOSIS — Z1231 Encounter for screening mammogram for malignant neoplasm of breast: Secondary | ICD-10-CM | POA: Diagnosis present

## 2020-08-02 ENCOUNTER — Other Ambulatory Visit: Payer: Self-pay | Admitting: Internal Medicine

## 2020-08-02 NOTE — Telephone Encounter (Signed)
This is a [place] pt 

## 2020-08-09 ENCOUNTER — Other Ambulatory Visit: Payer: Self-pay

## 2020-08-09 ENCOUNTER — Encounter: Payer: Self-pay | Admitting: Internal Medicine

## 2020-08-09 ENCOUNTER — Ambulatory Visit: Payer: Medicare PPO | Admitting: Internal Medicine

## 2020-08-09 VITALS — BP 124/72 | HR 56 | Ht 68.0 in | Wt 138.0 lb

## 2020-08-09 DIAGNOSIS — I471 Supraventricular tachycardia: Secondary | ICD-10-CM | POA: Diagnosis not present

## 2020-08-09 DIAGNOSIS — R001 Bradycardia, unspecified: Secondary | ICD-10-CM

## 2020-08-09 NOTE — Progress Notes (Signed)
      Patient Care Team: Casilda Carls, MD as PCP - General (Internal Medicine)   HPI  Whitney Bowers is a 74 y.o. female Seen in follow-up for abrupt onset offset tachypalpitations associated with presyncope.  ZIO patch monitor demonstrated atrial tachycardia.  She was started on low-dose flecainide.  Continues to do reasonably well without tachypalpitations.  Is having sporadic and rather disruptive infrequent palpitations where she feels like her heart just stops drops and then feels it in her head.  Often when she is quiet.  No specific triggers i.e. caffeine.    DATE PR interval QRSduration Dose-Flecainide  10/19  234 93 0  10/20 248 96 50  11/21  236  106  50           Past Medical History:  Diagnosis Date  . Arthritis   . Diabetes mellitus without complication ([place])   . Hyperglycemia   . Hyperlipidemia   . Hypertension   . Hypothyroidism   . Osteopenia   . Palpitations     Past Surgical History:  Procedure Laterality Date  . CESAREAN SECTION    . COLONOSCOPY WITH PROPOFOL N/A 08/23/2016   Procedure: COLONOSCOPY WITH PROPOFOL;  Surgeon: Lucilla Lame, MD;  Location: [place];  Service: Endoscopy;  Laterality: N/A;  diabetic   . MANDIBLE SURGERY    . POLYPECTOMY N/A 08/23/2016   Procedure: POLYPECTOMY;  Surgeon: Lucilla Lame, MD;  Location: [place];  Service: Endoscopy;  Laterality: N/A;    No outpatient medications have been marked as taking for the 08/09/20 encounter (Office Visit) with Deboraha Sprang, MD.    Allergies  Allergen Reactions  . Penicillins       Review of Systems negative except from HPI and PMH  Physical Exam BP 124/72   Pulse (!) 56   Ht 5\' 8"  (1.727 m)   Wt 138 lb (62.6 kg)   BMI 20.98 kg/m  Well developed and nourished in no acute distress HENT normal Neck supple with JVP-  flat   Clear Regular rate and rhythm, no murmurs or gallops Abd-soft with active BS No Clubbing cyanosis edema Skin-warm and  dry A & Oriented  Grossly normal sensory and motor function  ECG sinus with intervals as above  Assessment and  Plan  Presyncope  Atrial tachycardia-nonsustained  Sinus bradycardia with first-degree AV block  palpitations   doing well  Continue flecainide   Other palpitations sound like PVCs given the brevity and the associated discombobulated.  Have suggested she try over-the-counter magnesium  Current medicines are reviewed at length with the patient today .  The patient does not  have concerns regarding medicines.

## 2020-08-09 NOTE — Patient Instructions (Signed)

## 2020-09-01 ENCOUNTER — Other Ambulatory Visit: Payer: Self-pay | Admitting: Internal Medicine

## 2020-09-01 NOTE — Telephone Encounter (Signed)
This is a [place] pt 

## 2020-09-01 NOTE — Telephone Encounter (Signed)
Rx request sent to pharmacy.  

## 2021-02-23 ENCOUNTER — Other Ambulatory Visit: Payer: Self-pay | Admitting: Internal Medicine

## 2021-02-23 DIAGNOSIS — Z1231 Encounter for screening mammogram for malignant neoplasm of breast: Secondary | ICD-10-CM

## 2021-03-24 ENCOUNTER — Other Ambulatory Visit: Payer: Self-pay | Admitting: Internal Medicine

## 2021-05-04 ENCOUNTER — Encounter: Payer: Self-pay | Admitting: Gastroenterology

## 2021-05-04 ENCOUNTER — Ambulatory Visit: Payer: Medicare PPO | Admitting: Gastroenterology

## 2021-05-04 ENCOUNTER — Other Ambulatory Visit: Payer: Self-pay

## 2021-05-04 VITALS — BP 117/64 | HR 61 | Ht 68.0 in | Wt 135.4 lb

## 2021-05-04 DIAGNOSIS — R197 Diarrhea, unspecified: Secondary | ICD-10-CM | POA: Diagnosis not present

## 2021-05-04 MED ORDER — SUTAB 1479-225-188 MG PO TABS: 1.0000 | ORAL_TABLET | ORAL | 0 refills | Status: DC

## 2021-05-04 NOTE — Progress Notes (Signed)
Gastroenterology Consultation  Referring Provider:     Casilda Carls, MD Primary Care Physician:  Casilda Carls, MD Primary Gastroenterologist:  Dr. Allen Norris     Reason for Consultation:     Diarrhea        HPI:   Whitney Bowers is a 75 y.o. y/o female referred for consultation & management of diarrhea by Dr. Casilda Carls, MD. This patient comes in today after being seen by her primary care provider and referred back to me for a history of diarrhea.  The patient had a colonoscopy by me in 2017 with a 8 mm polyp found I was shown to be a tubular adenoma.  The patient was recommended to have a repeat colonoscopy in 5 years. The patient reports that she has diarrhea that comes intermittently.  She has tried to see what foods make it better or worse but she cannot pinpoint what is doing a.  The patient states he can happen as infrequently as once a month and can last as long as 1 day or as long as 3 days.  There is no report of any black stools or bloody stools.  The patient also reports that she had 1 episode that she knows was due to stress Whitney Bowers she was in the middle of the bed breakup.  The patient has been doing well recently and denies any unexplained weight loss black stools or bloody stools.  Past Medical History:  Diagnosis Date  . Arthritis   . Diabetes mellitus without complication ([place])   . Hyperglycemia   . Hyperlipidemia   . Hypertension   . Hypothyroidism   . Osteopenia   . Palpitations     Past Surgical History:  Procedure Laterality Date  . CESAREAN SECTION    . COLONOSCOPY WITH PROPOFOL N/A 08/23/2016   Procedure: COLONOSCOPY WITH PROPOFOL;  Surgeon: Lucilla Lame, MD;  Location: [place];  Service: Endoscopy;  Laterality: N/A;  diabetic   . MANDIBLE SURGERY    . POLYPECTOMY N/A 08/23/2016   Procedure: POLYPECTOMY;  Surgeon: Lucilla Lame, MD;  Location: [place];  Service: Endoscopy;  Laterality: N/A;    Prior to Admission medications    Medication Sig Start Date End Date Taking? Authorizing Provider  atorvastatin (LIPITOR) 10 MG tablet Take 10 mg by mouth daily.     [provider]  flecainide (TAMBOCOR) 50 MG tablet TAKE ONE (1) TABLET BY MOUTH TWO TIMES PER DAY 03/24/21   Sherren Mocha, MD  levothyroxine (SYNTHROID, LEVOTHROID) 50 MCG tablet Take 50 mcg by mouth daily.  06/02/18   [provider]  metFORMIN (GLUCOPHAGE) 500 MG tablet Takes 2 tablets am and 1 tablet pm daily. 05/15/18   [provider]  metoprolol tartrate (LOPRESSOR) 25 MG tablet Take 25 mg by mouth 2 (two) times daily.    [provider]    Family History  Problem Relation Age of Onset  . Breast cancer Maternal Aunt 60  . Hypertension Father   . Stroke Father      Social History   Tobacco Use  . Smoking status: Never  . Smokeless tobacco: Never  Vaping Use  . Vaping Use: Never used  Substance Use Topics  . Alcohol use: Yes    Alcohol/week: 7.0 standard drinks    Types: 7 Glasses of wine per week    Comment: wine  . Drug use: No    Allergies as of 05/04/2021 - Review Complete 08/09/2020  Allergen Reaction Noted  .  Penicillins  02/16/2016    Review of Systems:    All systems reviewed and negative except where noted in HPI.   Physical Exam:  There were no vitals taken for this visit. No LMP recorded. Patient is postmenopausal. General:   Alert,  Well-developed, well-nourished, pleasant and cooperative in NAD Head:  Normocephalic and atraumatic. Eyes:  Sclera clear, no icterus.   Conjunctiva pink. Ears:  Normal auditory acuity. Neck:  Supple; no masses or thyromegaly. Lungs:  Respirations even and unlabored.  Clear throughout to auscultation.   No wheezes, crackles, or rhonchi. No acute distress. Heart:  Regular rate and rhythm; no murmurs, clicks, rubs, or gallops. Abdomen:  Normal bowel sounds.  No bruits.  Soft, non-tender and non-distended without masses, hepatosplenomegaly or hernias noted.  No  guarding or rebound tenderness.  Negative Carnett sign.   Rectal:  Deferred.  Pulses:  Normal pulses noted. Extremities:  No clubbing or edema.  No cyanosis. Neurologic:  Alert and oriented x3;  grossly normal neurologically. Skin:  Intact without significant lesions or rashes.  No jaundice. Lymph Nodes:  No significant cervical adenopathy. Psych:  Alert and cooperative. Normal mood and affect.  Imaging Studies: No results found.  Assessment and Plan:   Whitney Bowers is a 75 y.o. y/o female who comes in today with a history of colon polyps and diarrhea.  The patient reports the diarrhea to be very intermittent and cannot put her finger on what may be exacerbating her symptoms. At least one occasion was due to stress but the other occasions did not have a obvious cause.  The patient has been told that her intermittent diarrhea is likely from either stress or something she's eating and she has been told to keep a close eye on what she may have the in the day before her diarrhea starts and she will be set up for colonoscopy due to her history of colon polyps.  The patient has been explained the plan and agrees with it.  She will follow up with time of colonoscopy.    Lucilla Lame, MD. Marval Regal    Note: This dictation was prepared with Dragon dictation along with smaller phrase technology. Any transcriptional errors that result from this process are unintentional.

## 2021-05-05 ENCOUNTER — Other Ambulatory Visit: Payer: Self-pay

## 2021-05-05 DIAGNOSIS — R197 Diarrhea, unspecified: Secondary | ICD-10-CM

## 2021-05-29 ENCOUNTER — Telehealth: Payer: Self-pay

## 2021-05-29 NOTE — Telephone Encounter (Signed)
Colonoscopy procedure has been cancelled.

## 2021-05-29 NOTE — Telephone Encounter (Signed)
Pt. Calling to cancel upcoming procedure. She has broken some bones in her body.

## 2021-06-08 ENCOUNTER — Ambulatory Visit: Admission: RE | Admit: 2021-06-08 | Payer: Medicare PPO | Source: Home / Self Care | Admitting: Gastroenterology

## 2021-06-08 ENCOUNTER — Encounter: Admission: RE | Payer: Self-pay | Source: Home / Self Care

## 2021-06-08 SURGERY — COLONOSCOPY WITH PROPOFOL
Anesthesia: General

## 2021-08-16 NOTE — Progress Notes (Signed)
      Patient Care Team: Casilda Carls, MD as PCP - General (Internal Medicine)   HPI  Whitney Bowers is a 75 y.o. female Seen in follow-up for abrupt onset offset tachypalpitations associated with presyncope.  ZIO patch monitor demonstrated atrial tachycardia.  She was started on low-dose flecainide.  The patient denies chest pain, shortness of breath, nocturnal dyspnea, orthopnea or peripheral edema.  There have been no palpitations, lightheadedness or syncope.   Palpitations are infrequent and not assoc with significant symptoms .    She took a terrible stumble in the summer but is getting back to normal   Date Cr K Hgb TSH LFTs                      DATE PR interval QRSduration Dose-Flecainide  10/19  234 93 0  10/20 248 96 50  11/21  236  106 50  12/22 236 100 50           Past Medical History:  Diagnosis Date   Arthritis    Diabetes mellitus without complication ([place])    Hyperglycemia    Hyperlipidemia    Hypertension    Hypothyroidism    Osteopenia    Palpitations     Past Surgical History:  Procedure Laterality Date   CESAREAN SECTION     COLONOSCOPY WITH PROPOFOL N/A 08/23/2016   Procedure: COLONOSCOPY WITH PROPOFOL;  Surgeon: Lucilla Lame, MD;  Location: [place];  Service: Endoscopy;  Laterality: N/A;  diabetic    MANDIBLE SURGERY     POLYPECTOMY N/A 08/23/2016   Procedure: POLYPECTOMY;  Surgeon: Lucilla Lame, MD;  Location: [place];  Service: Endoscopy;  Laterality: N/A;    Current Meds  Medication Sig   atorvastatin (LIPITOR) 10 MG tablet Take 10 mg by mouth daily.    levothyroxine (SYNTHROID, LEVOTHROID) 50 MCG tablet Take 50 mcg by mouth daily.    metFORMIN (GLUCOPHAGE) 500 MG tablet Takes 2 tablets am and 1 tablet pm daily.   metoprolol tartrate (LOPRESSOR) 25 MG tablet Take 25 mg by mouth 2 (two) times daily.   [DISCONTINUED] flecainide (TAMBOCOR) 50 MG tablet TAKE ONE (1) TABLET BY MOUTH TWO TIMES PER DAY     Allergies  Allergen Reactions   Penicillins       Review of Systems negative except from HPI and PMH  Physical Exam BP 122/78 (BP Location: Left Arm, Patient Position: Sitting, Cuff Size: Normal)   Pulse (!) 54   Ht 5\' 8"  (1.727 m)   Wt 139 lb (63 kg)   SpO2 96%   BMI 21.13 kg/m  Well developed and nourished in no acute distress HENT normal Neck supple with JVP-  flat  Clear Regular rate and rhythm, no murmurs or gallops Abd-soft with active BS No Clubbing cyanosis edema Skin-warm and dry A & Oriented  Grossly normal sensory and motor function  ECG sinus at 54 Intervals 24/10/22  Assessment and  Plan  Presyncope   Atrial tachycardia-nonsustained   Sinus bradycardia with first-degree AV block  palpitations   Doing well, scant palpitations.  We will continue flecainide but will decrease the dose from 50--25 twice daily  Sinus bradycardia seems asymptomatic.  We will continue Lopressor 25 twice daily in the context of her atrial tachycardia although, potentially could be discontinued

## 2021-08-17 ENCOUNTER — Encounter: Payer: Self-pay | Admitting: Internal Medicine

## 2021-08-17 ENCOUNTER — Ambulatory Visit: Payer: Medicare PPO | Admitting: Internal Medicine

## 2021-08-17 ENCOUNTER — Other Ambulatory Visit: Payer: Self-pay

## 2021-08-17 VITALS — BP 122/78 | HR 54 | Ht 68.0 in | Wt 139.0 lb

## 2021-08-17 DIAGNOSIS — I471 Supraventricular tachycardia: Secondary | ICD-10-CM | POA: Diagnosis not present

## 2021-08-17 DIAGNOSIS — R001 Bradycardia, unspecified: Secondary | ICD-10-CM | POA: Diagnosis not present

## 2021-08-17 MED ORDER — FLECAINIDE ACETATE 50 MG PO TABS: ORAL_TABLET | ORAL | 11 refills | Status: DC

## 2021-08-17 NOTE — Patient Instructions (Addendum)
Medication Instructions:  - Your physician has recommended you make the following change in your medication:   1) DECREASE Flecainide 50 mg: - take 0.5 tablet (25 mg) by mouth TWICE daily  *If you need a refill on your cardiac medications before your next appointment, please call your pharmacy*   Lab Work: - none ordered  If you have labs (blood work) drawn today and your tests are completely normal, you will receive your results only by: [place] (if you have MyChart) OR A paper copy in the mail If you have any lab test that is abnormal or we need to change your treatment, we will call you to review the results.   Testing/Procedures: - none ordered   Follow-Up: At [facility], you and your health needs are our priority.  As part of our continuing mission to provide you with exceptional heart care, we have created designated Provider Care Teams.  These Care Teams include your primary Cardiologist (physician) and Advanced Practice Providers (APPs -  Physician Assistants and Nurse Practitioners) who all work together to provide you with the care you need, when you need it.  We recommend signing up for the patient portal called "MyChart".  Sign up information is provided on this After Visit Summary.  MyChart is used to connect with patients for Virtual Visits (Telemedicine).  Patients are able to view lab/test results, encounter notes, upcoming appointments, etc.  Non-urgent messages can be sent to your provider as well.   To learn more about what you can do with MyChart, go to [email or web address].    Your next appointment:   1 year(s)  The format for your next appointment:   In Person  Provider:   Virl Axe, MD    Other Instructions N/a

## 2021-09-22 ENCOUNTER — Ambulatory Visit
Admission: RE | Admit: 2021-09-22 | Discharge: 2021-09-22 | Disposition: A | Discharge status: Home / Self Care | Payer: Medicare PPO | Source: Ambulatory Visit | Attending: Internal Medicine | Admitting: Internal Medicine

## 2021-09-22 ENCOUNTER — Other Ambulatory Visit: Payer: Self-pay

## 2021-09-22 DIAGNOSIS — Z1231 Encounter for screening mammogram for malignant neoplasm of breast: Secondary | ICD-10-CM | POA: Diagnosis not present

## 2021-11-27 ENCOUNTER — Encounter: Payer: Self-pay | Admitting: Dermatology

## 2021-12-21 ENCOUNTER — Encounter: Payer: Self-pay | Admitting: Dermatology

## 2021-12-21 ENCOUNTER — Ambulatory Visit: Payer: Medicare PPO | Admitting: Dermatology

## 2021-12-21 DIAGNOSIS — L578 Other skin changes due to chronic exposure to nonionizing radiation: Secondary | ICD-10-CM

## 2021-12-21 DIAGNOSIS — L719 Rosacea, unspecified: Secondary | ICD-10-CM

## 2021-12-21 DIAGNOSIS — D229 Melanocytic nevi, unspecified: Secondary | ICD-10-CM

## 2021-12-21 DIAGNOSIS — D1800 Hemangioma unspecified site: Secondary | ICD-10-CM

## 2021-12-21 DIAGNOSIS — L814 Other melanin hyperpigmentation: Secondary | ICD-10-CM | POA: Diagnosis not present

## 2021-12-21 DIAGNOSIS — Z1283 Encounter for screening for malignant neoplasm of skin: Secondary | ICD-10-CM | POA: Diagnosis not present

## 2021-12-21 DIAGNOSIS — L821 Other seborrheic keratosis: Secondary | ICD-10-CM

## 2021-12-21 DIAGNOSIS — L918 Other hypertrophic disorders of the skin: Secondary | ICD-10-CM

## 2021-12-21 DIAGNOSIS — I8393 Asymptomatic varicose veins of bilateral lower extremities: Secondary | ICD-10-CM

## 2021-12-21 DIAGNOSIS — L820 Inflamed seborrheic keratosis: Secondary | ICD-10-CM | POA: Diagnosis not present

## 2021-12-21 DIAGNOSIS — Z86018 Personal history of other benign neoplasm: Secondary | ICD-10-CM

## 2021-12-21 NOTE — Patient Instructions (Addendum)
Cryotherapy Aftercare ? ?Wash gently with soap and water everyday.   ?Apply Vaseline and Band-Aid daily until healed.  ? ?Prior to procedure, discussed risks of blister formation, small wound, skin dyspigmentation, or rare scar following cryotherapy. Recommend Vaseline ointment to treated areas while healing.  ? ? ?Recommend daily broad spectrum sunscreen SPF 30+ to sun-exposed areas, reapply every 2 hours as needed. Call for new or changing lesions.  ?Staying in the shade or wearing long sleeves, sun glasses (UVA+UVB protection) and wide brim hats (4-inch brim around the entire circumference of the hat) are also recommended for sun protection.  ? ? ?Melanoma ABCDEs ? ?Melanoma is the most dangerous type of skin cancer, and is the leading cause of death from skin disease.  You are more likely to develop melanoma if you: ?Have light-colored skin, light-colored eyes, or red or blond hair ?Spend a lot of time in the sun ?Tan regularly, either outdoors or in a tanning bed ?Have had blistering sunburns, especially during childhood ?Have a close family member who has had a melanoma ?Have atypical moles or large birthmarks ? ?Early detection of melanoma is key since treatment is typically straightforward and cure rates are extremely high if we catch it early.  ? ?The first sign of melanoma is often a change in a mole or a new dark spot.  The ABCDE system is a way of remembering the signs of melanoma. ? ?A for asymmetry:  The two halves do not match. ?B for border:  The edges of the growth are irregular. ?C for color:  A mixture of colors are present instead of an even brown color. ?D for diameter:  Melanomas are usually (but not always) greater than 6m - the size of a pencil eraser. ?E for evolution:  The spot keeps changing in size, shape, and color. ? ?Please check your skin once per month between visits. You can use a small mirror in front and a large mirror behind you to keep an eye on the back side or your body.   ? ?If you see any new or changing lesions before your next follow-up, please call to schedule a visit. ? ?Please continue daily skin protection including broad spectrum sunscreen SPF 30+ to sun-exposed areas, reapplying every 2 hours as needed when you're outdoors.  ? ?Staying in the shade or wearing long sleeves, sun glasses (UVA+UVB protection) and wide brim hats (4-inch brim around the entire circumference of the hat) are also recommended for sun protection.   ? ? ?If You Need Anything After Your Visit ? ?If you have any questions or concerns for your doctor, please call our main line at [phone] press option 4 to reach your doctor's medical assistant. If no one answers, please leave a voicemail as directed and we will return your call as soon as possible. Messages left after 4 pm will be answered the following business day.  ? ?You may also send [place] message via MyChart. We typically respond to MyChart messages within 1-2 business days. ? ?For prescription refills, please ask your pharmacy to contact our office. Our fax number is [phone] ? ?If you have an urgent issue when the clinic is closed that cannot wait until the next business day, you can page your doctor at the number below.   ? ?Please note that while we do our best to be available for urgent issues outside of office hours, we are not available 24/7.  ? ?If you have an urgent issue and  are unable to reach [place], you may choose to seek medical care at your doctor's office, retail clinic, urgent care center, or emergency room. ? ?If you have a medical emergency, please immediately call 911 or go to the emergency department. ? ?Pager Numbers ? ?- Dr. Nehemiah Massed: [phone] ? ?- Dr. Laurence Ferrari: [phone] ? ?- Dr. Nicole Kindred: [phone] ? ?In the event of inclement weather, please call our main line at [phone] for an update on the status of any delays or closures. ? ?Dermatology Medication Tips: ?Please keep the boxes that topical medications  come in in order to help keep track of the instructions about where and how to use these. Pharmacies typically print the medication instructions only on the boxes and not directly on the medication tubes.  ? ?If your medication is too expensive, please contact our office at [phone] option 4 or send [place] a message through [place].  ? ?We are unable to tell what your co-pay for medications will be in advance as this is different depending on your insurance coverage. However, we may be able to find a substitute medication at lower cost or fill out paperwork to get insurance to cover a needed medication.  ? ?If a prior authorization is required to get your medication covered by your insurance company, please allow [place] 1-2 business days to complete this process. ? ?Drug prices often vary depending on where the prescription is filled and some pharmacies may offer cheaper prices. ? ?The website www.goodrx.com contains coupons for medications through different pharmacies. The prices here do not account for what the cost may be with help from insurance (it may be cheaper with your insurance), but the website can give you the price if you did not use any insurance.  ?- You can print the associated coupon and take it with your prescription to the pharmacy.  ?- You may also stop by our office during regular business hours and pick up a GoodRx coupon card.  ?- If you need your prescription sent electronically to a different pharmacy, notify our office through [facility] or by phone at [phone] option 4. ? ? ? ? ?Si Usted Necesita Algo Despu?s de Su Visita ? ?Tambi?n puede enviarnos un mensaje a trav?s de MyChart. Por lo general respondemos a los mensajes de MyChart en el transcurso de 1 a 2 d?as h?biles. ? ?Para renovar recetas, por favor pida a su farmacia que se ponga en contacto con nuestra oficina. Nuestro n?mero de fax es el [phone]. ? ?Si tiene un asunto urgente cuando la cl?nica est? cerrada y que no  puede esperar hasta el siguiente d?a h?bil, puede llamar/localizar a su doctor(a) al n?mero que aparece a continuaci?n.  ? ?Por favor, tenga en cuenta que aunque hacemos todo lo posible para estar disponibles para asuntos urgentes fuera del horario de oficina, no estamos disponibles las 24 horas del d?a, los 7 d?as de la semana.  ? ?Si tiene un problema urgente y no puede comunicarse con nosotros, puede optar por buscar atenci?n m?dica  en el consultorio de su doctor(a), en una cl?nica privada, en un centro de atenci?n urgente o en una sala de emergencias. ? ?Si tiene Engineer, maintenance (IT) m?dica, por favor llame inmediatamente al 911 o vaya a la sala de emergencias. ? ?N?meros de b?per ? ?- Dr. Nehemiah Massed: [phone] ? ?- Dra. Moye: [phone] ? ?- Dra. Nicole Kindred: [phone] ? ?En caso de inclemencias del tiempo, por favor llame a nuestra l?nea principal al [phone] para una actualizaci?n [employer]  estado de cualquier retraso o cierre. ? ?Consejos para la medicaci?n en dermatolog?a: ?Por favor, guarde las cajas en las que vienen los medicamentos de uso t?pico para ayudarle a seguir las instrucciones sobre d?nde y c?mo usarlos. Las farmacias generalmente imprimen las instrucciones del medicamento s?lo en las cajas y no directamente en los tubos del [place].  ? ?Si su medicamento es muy caro, por favor, p?ngase en contacto con Zigmund Daniel llamando al [phone] y presione la opci?n 4 o env?enos un mensaje a trav?s de MyChart.  ? ?No podemos decirle cu?l ser? su copago por los medicamentos por adelantado ya que esto es diferente dependiendo de la cobertura de su seguro. Sin embargo, es posible que podamos encontrar un medicamento sustituto a Electrical engineer un formulario para que el seguro cubra el medicamento que se considera necesario.  ? ?Si se requiere Ardelia Mems autorizaci?n previa para que su compa??a de seguros [place] su medicamento, por favor perm?tanos de 1 a 2 d?as h?biles para completar este  proceso. ? ?Los precios de los medicamentos var?an con frecuencia dependiendo del Environmental consultant de d?nde se surte la receta y alguna farmacias pueden ofrecer precios m?s baratos. ? ?El sitio web www.goodrx.com tiene cupones

## 2021-12-21 NOTE — Progress Notes (Signed)
? ?New Patient Visit ? ?Subjective  ?Whitney Bowers is a 76 y.o. female who presents for the following: Annual Exam (Hx of dysplastic nevus. No personal H/O skin cancer). ?The patient presents for Total-Body Skin Exam (TBSE) for skin cancer screening and mole check.  The patient has spots, moles and lesions to be evaluated, some may be new or changing and the patient has concerns that these could be cancer. ? ?Review of Systems: No other skin or systemic complaints except as noted in HPI or Assessment and Plan. ? ?Objective  ?Well appearing patient in no apparent distress; mood and affect are within normal limits. ? ?A full examination was performed including scalp, head, eyes, ears, nose, lips, neck, chest, axillae, abdomen, back, buttocks, bilateral upper extremities, bilateral lower extremities, hands, feet, fingers, toes, fingernails, and toenails. All findings within normal limits unless otherwise noted below. ? ?Right Mandible x1, right pretibial x1 (2) ?Erythematous keratotic or waxy stuck-on papule or plaque. ? ?face ?Moderate macular erythema and telangiectasias. ? ? ?Assessment & Plan  ? ?Lentigines ?- Scattered tan macules ?- Due to sun exposure ?- Benign-appearing, observe ?- Recommend daily broad spectrum sunscreen SPF 30+ to sun-exposed areas, reapply every 2 hours as needed. ?- Call for any changes ? ?Seborrheic Keratoses ?- Stuck-on, waxy, tan-brown papules and/or plaques  ?- Benign-appearing ?- Discussed benign etiology and prognosis. ?- Observe ?- Call for any changes ? ?Melanocytic Nevi ?- Tan-brown and/or pink-flesh-colored symmetric macules and papules ?- Benign appearing on exam today ?- Observation ?- Call clinic for new or changing moles ?- Recommend daily use of broad spectrum spf 30+ sunscreen to sun-exposed areas.  ? ?Hemangiomas ?- Red papules ?- Discussed benign nature ?- Observe ?- Call for any changes ? ?Actinic Damage ?- Chronic condition, secondary to cumulative UV/sun exposure ?-  diffuse scaly erythematous macules with underlying dyspigmentation ?- Recommend daily broad spectrum sunscreen SPF 30+ to sun-exposed areas, reapply every 2 hours as needed.  ?- Staying in the shade or wearing long sleeves, sun glasses (UVA+UVB protection) and wide brim hats (4-inch brim around the entire circumference of the hat) are also recommended for sun protection.  ?- Call for new or changing lesions. ? ?Skin cancer screening performed today. ? ?History of Dysplastic Nevus ?- No evidence of recurrence today ?- Recommend regular full body skin exams ?- Recommend daily broad spectrum sunscreen SPF 30+ to sun-exposed areas, reapply every 2 hours as needed.  ?- Call if any new or changing lesions are noted between office visits  ? ?Varicose Veins/Spider Veins ?- Dilated blue, purple or red veins at the lower extremities ?- Reassured ?- Smaller vessels can be treated by sclerotherapy (a procedure to inject a medicine into the veins to make them disappear) if desired, but the treatment is not covered by insurance. Larger vessels may be covered if symptomatic and we would refer to vascular surgeon if treatment desired.  ? ?Acrochordons (Skin Tags) ?- Fleshy, skin-colored pedunculated papules ?- Benign appearing.  ?- Observe. ?- If desired, they can be removed with an in office procedure that is not covered by insurance. ?- Please call the clinic if you notice any new or changing lesions.  ? ?Inflamed seborrheic keratosis (2) ?Right Mandible x1, right pretibial x1 ? ?Destruction of lesion - Right Mandible x1, right pretibial x1 ?Complexity: simple   ?Destruction method: cryotherapy   ?Informed consent: discussed and consent obtained   ?Timeout:  patient name, date of birth, surgical site, and procedure verified ?Lesion destroyed using liquid  nitrogen: Yes   ?Region frozen until ice ball extended beyond lesion: Yes   ?Outcome: patient tolerated procedure well with no complications   ?Post-procedure details: wound care  instructions given   ? ?Rosacea ?face ? ?Rosacea is a chronic progressive skin condition usually affecting the face of adults, causing redness and/or acne bumps. It is treatable but not curable. It sometimes affects the eyes (ocular rosacea) as well. It may respond to topical and/or systemic medication and can flare with stress, sun exposure, alcohol, exercise and some foods.  Daily application of broad spectrum spf 30+ sunscreen to face is recommended to reduce flares. ? ?Discussed the treatment option of BBL/laser.  Typically we recommend 1-3 treatment sessions about 5-8 weeks apart for best results.  The patient's condition may require "maintenance treatments" in the future.  The fee for BBL / laser treatments is $350 per treatment session for the whole face.  A fee can be quoted for other parts of the body. ?Insurance typically does not pay for BBL/laser treatments and therefore the fee is an out-of-pocket cost.  ? ?Skin cancer screening ? ? ?Return for TBSE in 1-3 years. ? ?I, Emelia Salisbury, CMA, am acting as scribe for Sarina Ser, MD. ?Documentation: I have reviewed the above documentation for accuracy and completeness, and I agree with the above. ? ?Sarina Ser, MD ? ? ?

## 2021-12-22 ENCOUNTER — Encounter: Payer: Self-pay | Admitting: Dermatology

## 2022-02-26 ENCOUNTER — Telehealth: Payer: Self-pay

## 2022-02-26 NOTE — Telephone Encounter (Signed)
CALLED PATIENT NO ANSWER LEFT VOICEMAIL FOR A CALL BACK °Letter sent °

## 2022-02-27 ENCOUNTER — Telehealth: Payer: Self-pay

## 2022-02-27 ENCOUNTER — Other Ambulatory Visit: Payer: Self-pay

## 2022-02-27 DIAGNOSIS — Z86010 Personal history of colonic polyps: Secondary | ICD-10-CM

## 2022-02-27 NOTE — Telephone Encounter (Signed)
Gastroenterology Pre-Procedure Review  Request Date: 04/24/22 Requesting Physician: Dr. Allen Norris  PATIENT REVIEW QUESTIONS: The patient responded to the following health history questions as indicated:    1. Are you having any GI issues? Occasional changes in bowel habits just comes and goes 2. Do you have a personal history of Polyps? yes (last colonoscopy 08/23/16 performed by Dr. Allen Norris) 3. Do you have a family history of Colon Cancer or Polyps? no 4. Diabetes Mellitus? no 5. Joint replacements in the past 12 months?no 6. Major health problems in the past 3 months?no 7. Any artificial heart valves, MVP, or defibrillator?no    MEDICATIONS & ALLERGIES:    Patient reports the following regarding taking any anticoagulation/antiplatelet therapy:   Plavix, Coumadin, Eliquis, Xarelto, Lovenox, Pradaxa, Brilinta, or Effient? no Aspirin? no  Patient confirms/reports the following medications:  Current Outpatient Medications  Medication Sig Dispense Refill   atorvastatin (LIPITOR) 10 MG tablet Take 10 mg by mouth daily.      flecainide (TAMBOCOR) 50 MG tablet Take 0.5 tablet (25 mg) by mouth TWICE daily 60 tablet 11   levothyroxine (SYNTHROID, LEVOTHROID) 50 MCG tablet Take 50 mcg by mouth daily.      metFORMIN (GLUCOPHAGE) 500 MG tablet Takes 2 tablets am and 1 tablet pm daily.     metoprolol tartrate (LOPRESSOR) 25 MG tablet Take 25 mg by mouth 2 (two) times daily.     No current facility-administered medications for this visit.    Patient confirms/reports the following allergies:  Allergies  Allergen Reactions   Penicillins     No orders of the defined types were placed in this encounter.   AUTHORIZATION INFORMATION Primary Insurance: 1D#: Group #:  Secondary Insurance: 1D#: Group #:  SCHEDULE INFORMATION: Date: 04/24/22 Time: Location: [facility]

## 2022-04-23 ENCOUNTER — Encounter: Payer: Self-pay | Admitting: Gastroenterology

## 2022-04-24 ENCOUNTER — Encounter: Payer: Self-pay | Admitting: Gastroenterology

## 2022-04-24 ENCOUNTER — Ambulatory Visit: Payer: Medicare PPO | Admitting: Anesthesiology

## 2022-04-24 ENCOUNTER — Ambulatory Visit
Admission: RE | Admit: 2022-04-24 | Discharge: 2022-04-24 | Disposition: A | Discharge status: Home / Self Care | Payer: Medicare PPO | Attending: Gastroenterology | Admitting: Gastroenterology

## 2022-04-24 ENCOUNTER — Encounter
Admission: RE | Disposition: A | Discharge status: Home / Self Care | Payer: Self-pay | Source: Home / Self Care | Attending: Gastroenterology

## 2022-04-24 DIAGNOSIS — Z7984 Long term (current) use of oral hypoglycemic drugs: Secondary | ICD-10-CM | POA: Diagnosis not present

## 2022-04-24 DIAGNOSIS — K5730 Diverticulosis of large intestine without perforation or abscess without bleeding: Secondary | ICD-10-CM | POA: Insufficient documentation

## 2022-04-24 DIAGNOSIS — E785 Hyperlipidemia, unspecified: Secondary | ICD-10-CM | POA: Diagnosis not present

## 2022-04-24 DIAGNOSIS — Z1211 Encounter for screening for malignant neoplasm of colon: Secondary | ICD-10-CM | POA: Diagnosis present

## 2022-04-24 DIAGNOSIS — K640 First degree hemorrhoids: Secondary | ICD-10-CM | POA: Diagnosis not present

## 2022-04-24 DIAGNOSIS — I10 Essential (primary) hypertension: Secondary | ICD-10-CM | POA: Insufficient documentation

## 2022-04-24 DIAGNOSIS — E119 Type 2 diabetes mellitus without complications: Secondary | ICD-10-CM | POA: Diagnosis not present

## 2022-04-24 DIAGNOSIS — E039 Hypothyroidism, unspecified: Secondary | ICD-10-CM | POA: Diagnosis not present

## 2022-04-24 DIAGNOSIS — Z860100 Personal history of colon polyps, unspecified: Secondary | ICD-10-CM

## 2022-04-24 HISTORY — PX: COLONOSCOPY WITH PROPOFOL: SHX5780

## 2022-04-24 LAB — GLUCOSE, CAPILLARY: Glucose-Capillary: 98 mg/dL (ref 70–99)

## 2022-04-24 SURGERY — COLONOSCOPY WITH PROPOFOL
Anesthesia: General

## 2022-04-24 MED ORDER — SODIUM CHLORIDE 0.9 % IV SOLN: INTRAVENOUS | Status: DC

## 2022-04-24 MED ORDER — PROPOFOL 10 MG/ML IV BOLUS
INTRAVENOUS | Status: DC | PRN
  Administered 2022-04-24: 20 mg via INTRAVENOUS

## 2022-04-24 MED ORDER — LIDOCAINE HCL (CARDIAC) PF 100 MG/5ML IV SOSY
PREFILLED_SYRINGE | INTRAVENOUS | Status: DC | PRN
  Administered 2022-04-24: 50 mg via INTRAVENOUS

## 2022-04-24 MED ORDER — PROPOFOL 500 MG/50ML IV EMUL
INTRAVENOUS | Status: DC | PRN
  Administered 2022-04-24: 100 ug/kg/min via INTRAVENOUS

## 2022-04-24 NOTE — Anesthesia Postprocedure Evaluation (Signed)
Anesthesia Post Note  Patient: Whitney Bowers  Procedure(s) Performed: COLONOSCOPY WITH PROPOFOL  Patient location during evaluation: PACU Anesthesia Type: General Level of consciousness: awake and alert Pain management: pain level controlled Vital Signs Assessment: post-procedure vital signs reviewed and stable Respiratory status: spontaneous breathing, nonlabored ventilation and respiratory function stable Cardiovascular status: blood pressure returned to baseline and stable Postop Assessment: no apparent nausea or vomiting Anesthetic complications: no   No notable events documented.   Last Vitals:  Vitals:   04/24/22 0936 04/24/22 0946  BP: 121/74 112/84  Pulse: (!) 54 (!) 57  Resp: 12 13  Temp:    SpO2: 99% 99%    Last Pain:  Vitals:   04/24/22 0946  TempSrc:   PainSc: 0-No pain                 [place] Ouch

## 2022-04-24 NOTE — H&P (Signed)
Lucilla Lame, MD [address] [place], [place] [address] [phone] Fax : [phone]  Primary Care Physician:  Casilda Carls, MD Primary Gastroenterologist:  Dr. Allen Norris  Pre-Procedure History & Physical: HPI:  Whitney Bowers is a 76 y.o. female is here for an colonoscopy.   Past Medical History:  Diagnosis Date   Arthritis    Diabetes mellitus without complication ([place])    Dysplastic nevus 09/20/2009   Left mid volar forearm. Mild atypia.   Hyperglycemia    Hyperlipidemia    Hypertension    Hypothyroidism    Osteopenia    Palpitations     Past Surgical History:  Procedure Laterality Date   CESAREAN SECTION     COLONOSCOPY WITH PROPOFOL N/A 08/23/2016   Procedure: COLONOSCOPY WITH PROPOFOL;  Surgeon: Lucilla Lame, MD;  Location: [facility];  Service: Endoscopy;  Laterality: N/A;  diabetic    MANDIBLE SURGERY     POLYPECTOMY N/A 08/23/2016   Procedure: POLYPECTOMY;  Surgeon: Lucilla Lame, MD;  Location: [facility];  Service: Endoscopy;  Laterality: N/A;    Prior to Admission medications   Medication Sig Start Date End Date Taking? Authorizing Provider  atorvastatin (LIPITOR) 10 MG tablet Take 10 mg by mouth daily.    Yes [provider]  flecainide (TAMBOCOR) 50 MG tablet Take 0.5 tablet (25 mg) by mouth TWICE daily 08/17/21  Yes Deboraha Sprang, MD  levothyroxine (SYNTHROID, LEVOTHROID) 50 MCG tablet Take 50 mcg by mouth daily.  06/02/18  Yes [provider]  metFORMIN (GLUCOPHAGE) 500 MG tablet Takes 2 tablets am and 1 tablet pm daily. 05/15/18  Yes [provider]  metoprolol tartrate (LOPRESSOR) 25 MG tablet Take 25 mg by mouth 2 (two) times daily.   Yes [provider]    Allergies as of 02/28/2022 - Review Complete 12/22/2021  Allergen Reaction Noted   Penicillins  02/16/2016    Family History  Problem Relation Age of Onset   Breast cancer Maternal Aunt 48   Hypertension Father     Stroke Father     Social History   Socioeconomic History   Marital status: Widowed    Spouse name: Not on file   Number of children: Not on file   Years of education: Not on file   Highest education level: Not on file  Occupational History   Not on file  Tobacco Use   Smoking status: Never   Smokeless tobacco: Never  Vaping Use   Vaping Use: Never used  Substance and Sexual Activity   Alcohol use: Yes    Alcohol/week: 7.0 standard drinks of alcohol    Types: 7 Glasses of wine per week    Comment: wine   Drug use: No   Sexual activity: Not on file  Other Topics Concern   Not on file  Social History Narrative   Not on file   Social Determinants of Health   Financial Resource Strain: Not on file  Food Insecurity: Not on file  Transportation Needs: Not on file  Physical Activity: Not on file  Stress: Not on file  Social Connections: Not on file  Intimate Partner Violence: Not on file    Review of Systems: See HPI, otherwise negative ROS  Physical Exam: BP 118/83   Pulse 66   Temp (!) 96.6 F (35.9 C) (Temporal)   Resp 16   Ht '5\' 8"'$  (1.727 m)   Wt 61.7 kg   SpO2 100%   BMI 20.68  kg/m  General:   Alert,  pleasant and cooperative in NAD Head:  Normocephalic and atraumatic. Neck:  Supple; no masses or thyromegaly. Lungs:  Clear throughout to auscultation.    Heart:  Regular rate and rhythm. Abdomen:  Soft, nontender and nondistended. Normal bowel sounds, without guarding, and without rebound.   Neurologic:  Alert and  oriented x4;  grossly normal neurologically.  Impression/Plan: Whitney Bowers is here for an colonoscopy to be performed for a history of adenomatous polyps on 2018  Risks, benefits, limitations, and alternatives regarding  colonoscopy have been reviewed with the patient.  Questions have been answered.  All parties agreeable.   Lucilla Lame, MD  04/24/2022, 8:32 AM

## 2022-04-24 NOTE — Transfer of Care (Signed)
Immediate Anesthesia Transfer of Care Note  Patient: Whitney Bowers  Procedure(s) Performed: COLONOSCOPY WITH PROPOFOL  Patient Location: PACU and Endoscopy Unit  Anesthesia Type:General  Level of Consciousness: awake, alert  and oriented  Airway & Oxygen Therapy: Patient Spontanous Breathing  Post-op Assessment: Report given to RN and Post -op Vital signs reviewed and stable  Post vital signs: Reviewed and stable  Last Vitals:  Vitals Value Taken Time  BP 118/60 04/24/22 0926  Temp    Pulse 54 04/24/22 0926  Resp 12 04/24/22 0926  SpO2 99 % 04/24/22 0926    Last Pain:  Vitals:   04/24/22 0926  TempSrc:   PainSc: 0-No pain         Complications: No notable events documented.

## 2022-04-24 NOTE — Op Note (Signed)
[facility] Patient Name: Whitney Bowers Procedure Date: 04/24/2022 9:02 AM MRN: [id number] Account #: [id number] Date of Birth: [birth year] Admit Type: Outpatient Age: 76 Room: [facility] ENDO ROOM 4 Gender: Female Note Status: Finalized Instrument Name: Jasper Riling [id number] Procedure:             Colonoscopy Indications:           High risk colon cancer surveillance: Personal history                         of colonic polyps Providers:             Lucilla Lame MD, MD Referring MD:          Casilda Carls, MD (Referring MD) Medicines:             Propofol per Anesthesia Complications:         No immediate complications. Procedure:             Pre-Anesthesia Assessment:                        - Prior to the procedure, a History and Physical was                         performed, and patient medications and allergies were                         reviewed. The patient's tolerance of previous                         anesthesia was also reviewed. The risks and benefits                         of the procedure and the sedation options and risks                         were discussed with the patient. All questions were                         answered, and informed consent was obtained. Prior                         Anticoagulants: The patient has taken no previous                         anticoagulant or antiplatelet agents. ASA Grade                         Assessment: II - A patient with mild systemic disease.                         After reviewing the risks and benefits, the patient                         was deemed in satisfactory condition to undergo the                         procedure.  After obtaining informed consent, the colonoscope was                         passed under direct vision. Throughout the procedure,                         the patient's blood pressure, pulse, and oxygen                         saturations were  monitored continuously. The                         Colonoscope was introduced through the anus and                         advanced to the the cecum, identified by appendiceal                         orifice and ileocecal valve. The colonoscopy was                         performed without difficulty. The patient tolerated                         the procedure well. The quality of the bowel                         preparation was excellent. Findings:      The perianal and digital rectal examinations were normal.      Multiple small-mouthed diverticula were found in the sigmoid colon.      Non-bleeding internal hemorrhoids were found during retroflexion. The       hemorrhoids were Grade I (internal hemorrhoids that do not prolapse). Impression:            - Diverticulosis in the sigmoid colon.                        - Non-bleeding internal hemorrhoids.                        - No specimens collected. Recommendation:        - Discharge patient to home.                        - Resume previous diet.                        - Continue present medications.                        - Repeat colonoscopy is not recommended for                         surveillance. Procedure Code(s):     --- Professional ---                        [phone], Colonoscopy, flexible; diagnostic, including                         collection of specimen(s) by brushing or  washing, when                         performed (separate procedure) Diagnosis Code(s):     --- Professional ---                        Z86.010, Personal history of colonic polyps CPT copyright 2019 American Medical Association. All rights reserved. The codes documented in this report are preliminary and upon coder review may  be revised to meet current compliance requirements. Lucilla Lame MD, MD 04/24/2022 9:21:08 AM This report has been signed electronically. Number of Addenda: 0 Note Initiated On: 04/24/2022 9:02 AM Scope Withdrawal Time: 0 hours 6  minutes 46 seconds  Total Procedure Duration: 0 hours 12 minutes 14 seconds  Estimated Blood Loss:  Estimated blood loss: none.      [facility]

## 2022-04-24 NOTE — Anesthesia Preprocedure Evaluation (Signed)
Anesthesia Evaluation  Patient identified by MRN, date of birth, ID band Patient awake    Reviewed: Allergy & Precautions, NPO status , Patient's Chart, lab work & pertinent test results, reviewed documented beta blocker date and time   Airway Mallampati: II  TM Distance: >3 FB Neck ROM: full    Dental no notable dental hx.    Pulmonary neg pulmonary ROS,    Pulmonary exam normal        Cardiovascular hypertension, Pt. on medications and Pt. on home beta blockers Normal cardiovascular exam     Neuro/Psych negative neurological ROS  negative psych ROS   GI/Hepatic negative GI ROS, Neg liver ROS,   Endo/Other  diabetes, Type 2, Oral Hypoglycemic AgentsHypothyroidism   Renal/GU negative Renal ROS  negative genitourinary   Musculoskeletal   Abdominal Normal abdominal exam  (+)   Peds  Hematology negative hematology ROS (+)   Anesthesia Other Findings Past Medical History: No date: Arthritis No date: Diabetes mellitus without complication ([place]) 96/12/5407: Dysplastic nevus     Comment:  Left mid volar forearm. Mild atypia. No date: Hyperglycemia No date: Hyperlipidemia No date: Hypertension No date: Hypothyroidism No date: Osteopenia No date: Palpitations  Past Surgical History: No date: CESAREAN SECTION 08/23/2016: COLONOSCOPY WITH PROPOFOL; N/A     Comment:  Procedure: COLONOSCOPY WITH PROPOFOL;  Surgeon: Lucilla Lame, MD;  Location: [place];  Service:               Endoscopy;  Laterality: N/A;  diabetic  No date: MANDIBLE SURGERY 08/23/2016: POLYPECTOMY; N/A     Comment:  Procedure: POLYPECTOMY;  Surgeon: Lucilla Lame, MD;                Location: [place];  Service: Endoscopy;                Laterality: N/A;  BMI    Body Mass Index: 20.68 kg/m      Reproductive/Obstetrics negative OB ROS                             Anesthesia  Physical Anesthesia Plan  ASA: 2  Anesthesia Plan: General   Post-op Pain Management: Minimal or no pain anticipated   Induction: Intravenous  PONV Risk Score and Plan: Propofol infusion and TIVA  Airway Management Planned: Natural Airway  Additional Equipment:   Intra-op Plan:   Post-operative Plan:   Informed Consent: I have reviewed the patients History and Physical, chart, labs and discussed the procedure including the risks, benefits and alternatives for the proposed anesthesia with the patient or authorized representative who has indicated his/her understanding and acceptance.     Dental Advisory Given  Plan Discussed with: Anesthesiologist, CRNA and Surgeon  Anesthesia Plan Comments:         Anesthesia Quick Evaluation

## 2022-04-25 ENCOUNTER — Encounter: Payer: Self-pay | Admitting: Gastroenterology

## 2022-08-03 ENCOUNTER — Other Ambulatory Visit: Payer: Self-pay | Admitting: Cardiovascular Disease

## 2022-08-03 NOTE — Telephone Encounter (Signed)
Please schedule 12 month F/U appt for refills. Thank you!

## 2022-08-15 NOTE — Telephone Encounter (Signed)
Patient scheduled for 10/25/22

## 2022-10-25 ENCOUNTER — Ambulatory Visit: Payer: Medicare PPO | Attending: Internal Medicine | Admitting: Internal Medicine

## 2022-10-25 ENCOUNTER — Encounter: Payer: Self-pay | Admitting: Internal Medicine

## 2022-10-25 VITALS — BP 116/72 | HR 61 | Ht 68.0 in | Wt 144.0 lb

## 2022-10-25 DIAGNOSIS — R55 Syncope and collapse: Secondary | ICD-10-CM

## 2022-10-25 DIAGNOSIS — R001 Bradycardia, unspecified: Secondary | ICD-10-CM | POA: Diagnosis not present

## 2022-10-25 DIAGNOSIS — I440 Atrioventricular block, first degree: Secondary | ICD-10-CM

## 2022-10-25 DIAGNOSIS — I4719 Other supraventricular tachycardia: Secondary | ICD-10-CM

## 2022-10-25 MED ORDER — FLECAINIDE ACETATE 50 MG PO TABS: 25.0000 mg | ORAL_TABLET | Freq: Two times a day (BID) | ORAL | 1 refills | Status: AC

## 2022-10-25 NOTE — Progress Notes (Signed)
Patient Care Team: Casilda Carls, MD as PCP - General (Internal Medicine)   HPI  Whitney Bowers is a 77 y.o. female Seen in follow-up for abrupt onset offset tachypalpitations associated with presyncope.  ZIO patch monitor demonstrated atrial tachycardia.  She was started on low-dose flecainide.Dose to 25 at the last visit but she takes it somewhat irregularly.   Date Cr K Hgb TSH LFTs                      DATE PR interval QRSduration Dose-Flecainide  10/19  234 93 0  10/20 248 96 50  11/21  236  106 50  12/22 236 100 50  1/24 240 09 25           Past Medical History:  Diagnosis Date   Arthritis    Diabetes mellitus without complication (HCC)    Dysplastic nevus 09/20/2009   Left mid volar forearm. Mild atypia.   Hyperglycemia    Hyperlipidemia    Hypertension    Hypothyroidism    Osteopenia    Palpitations     Past Surgical History:  Procedure Laterality Date   CESAREAN SECTION     COLONOSCOPY WITH PROPOFOL N/A 08/23/2016   Procedure: COLONOSCOPY WITH PROPOFOL;  Surgeon: Lucilla Lame, MD;  Location: [place];  Service: Endoscopy;  Laterality: N/A;  diabetic    COLONOSCOPY WITH PROPOFOL N/A 04/24/2022   Procedure: COLONOSCOPY WITH PROPOFOL;  Surgeon: Lucilla Lame, MD;  Location: [facility];  Service: Endoscopy;  Laterality: N/A;   MANDIBLE SURGERY     POLYPECTOMY N/A 08/23/2016   Procedure: POLYPECTOMY;  Surgeon: Lucilla Lame, MD;  Location: [place];  Service: Endoscopy;  Laterality: N/A;    Current Meds  Medication Sig   atorvastatin (LIPITOR) 10 MG tablet Take 10 mg by mouth daily.    flecainide (TAMBOCOR) 50 MG tablet Take 25 mg by mouth 2 (two) times daily.   levothyroxine (SYNTHROID, LEVOTHROID) 50 MCG tablet Take 50 mcg by mouth daily.    metFORMIN (GLUCOPHAGE) 500 MG tablet Takes 2 tablets am and 1 tablet pm daily.   metoprolol tartrate (LOPRESSOR) 25 MG tablet Take 25 mg by mouth 2 (two) times daily.    Allergies   Allergen Reactions   Penicillins       Review of Systems negative except from HPI and PMH  Physical Exam BP 116/72 (BP Location: Left Arm, Patient Position: Sitting, Cuff Size: Normal)   Pulse 61   Ht '5\' 8"'$  (1.727 m)   Wt 144 lb (65.3 kg)   SpO2 97%   BMI 21.90 kg/m  Well developed and nourished in no acute distress HENT normal Neck supple  Clear Regular rate and rhythm, no murmurs or gallops Abd-soft with active BS No Clubbing cyanosis edema Skin-warm and dry A & Oriented  Grossly normal sensory and motor function  ECG sinus at 61 Intervals 24/09/40  Assessment and  Plan  Presyncope   Atrial tachycardia-nonsustained   Sinus bradycardia with first-degree AV block  palpitations   History of hypertension apparently, and so was on metoprolol.  As we are going to make her flecainide as needed we will not make any adjustment on this but I would be inclined towards an alternative antihypertensive if in fact her blood pressure was significantly elevated (a little bit hard for me to believe) which might afford much better long-term benefit.  Her flecainide we will change to take it as needed which  she will take 100 mg as needed with palpitations

## 2022-10-25 NOTE — Patient Instructions (Signed)
Medication Instructions:  Your physician has recommended you make the following change in your medication:   flecainide (TAMBOCOR) 50 MG tablet - 1 tablet by mouth as needed  *If you need a refill on your cardiac medications before your next appointment, please call your pharmacy*   Lab Work: -None ordered If you have labs (blood work) drawn today and your tests are completely normal, you will receive your results only by: [place] (if you have MyChart) OR A paper copy in the mail If you have any lab test that is abnormal or we need to change your treatment, we will call you to review the results.   Testing/Procedures: -None ordered   Follow-Up: At [facility], you and your health needs are our priority.  As part of our continuing mission to provide you with exceptional heart care, we have created designated Provider Care Teams.  These Care Teams include your primary Cardiologist (physician) and Advanced Practice Providers (APPs -  Physician Assistants and Nurse Practitioners) who all work together to provide you with the care you need, when you need it.  We recommend signing up for the patient portal called "MyChart".  Sign up information is provided on this After Visit Summary.  MyChart is used to connect with patients for Virtual Visits (Telemedicine).  Patients are able to view lab/test results, encounter notes, upcoming appointments, etc.  Non-urgent messages can be sent to your provider as well.   To learn more about what you can do with MyChart, go to [email or web address].    Your next appointment:   1 year(s)  Provider:   Virl Axe, MD    Other Instructions -None

## 2023-03-12 ENCOUNTER — Other Ambulatory Visit: Payer: Self-pay | Admitting: Internal Medicine

## 2023-03-12 DIAGNOSIS — Z1231 Encounter for screening mammogram for malignant neoplasm of breast: Secondary | ICD-10-CM

## 2023-04-17 ENCOUNTER — Ambulatory Visit
Admission: RE | Admit: 2023-04-17 | Discharge: 2023-04-17 | Disposition: A | Discharge status: Home / Self Care | Payer: Medicare PPO | Source: Ambulatory Visit | Attending: Internal Medicine | Admitting: Internal Medicine

## 2023-04-17 DIAGNOSIS — Z1231 Encounter for screening mammogram for malignant neoplasm of breast: Secondary | ICD-10-CM | POA: Insufficient documentation

## 2023-09-03 ENCOUNTER — Encounter: Payer: Self-pay | Admitting: Ophthalmology

## 2023-09-06 ENCOUNTER — Encounter: Payer: Self-pay | Admitting: Ophthalmology

## 2023-09-06 NOTE — Anesthesia Preprocedure Evaluation (Addendum)
Anesthesia Evaluation    Airway Mallampati: II  TM Distance: >3 FB     Dental   Pulmonary           Cardiovascular hypertension,   Negative stress test 09-04-18   Neuro/Psych    GI/Hepatic   Endo/Other  diabetes    Renal/GU      Musculoskeletal   Abdominal   Peds  Hematology   Anesthesia Other Findings Previous hx colonoscopy procedure Hypertension  Palpitations Arthritis  Diabetes mellitus without complication Osteopenia  Hyperlipidemia Hyperglycemia  Hypothyroidism Dysplastic nevus  Wears hearing aid in both ears    Reproductive/Obstetrics                              Anesthesia Physical Anesthesia Plan  ASA: 2  Anesthesia Plan: MAC   Post-op Pain Management:    Induction: Intravenous  PONV Risk Score and Plan:   Airway Management Planned: Natural Airway and Nasal Cannula  Additional Equipment:   Intra-op Plan:   Post-operative Plan:   Informed Consent: I have reviewed the patients History and Physical, chart, labs and discussed the procedure including the risks, benefits and alternatives for the proposed anesthesia with the patient or authorized representative who has indicated his/her understanding and acceptance.     Dental Advisory Given  Plan Discussed with: Anesthesiologist, CRNA and Surgeon  Anesthesia Plan Comments: (Patient consented for risks of anesthesia including but not limited to:  - adverse reactions to medications - damage to eyes, teeth, lips or other oral mucosa - nerve damage due to positioning  - sore throat or hoarseness - Damage to heart, brain, nerves, lungs, other parts of body or loss of life  Patient voiced understanding and assent.)         Anesthesia Quick Evaluation

## 2023-09-19 NOTE — Discharge Instructions (Signed)

## 2023-09-23 ENCOUNTER — Ambulatory Visit: Payer: Medicare PPO | Admitting: Anesthesiology

## 2023-09-23 ENCOUNTER — Other Ambulatory Visit: Payer: Self-pay

## 2023-09-23 ENCOUNTER — Encounter: Payer: Self-pay | Admitting: Ophthalmology

## 2023-09-23 ENCOUNTER — Encounter
Admission: RE | Disposition: A | Discharge status: Home / Self Care | Payer: Self-pay | Source: Home / Self Care | Attending: Ophthalmology

## 2023-09-23 ENCOUNTER — Ambulatory Visit
Admission: RE | Admit: 2023-09-23 | Discharge: 2023-09-23 | Disposition: A | Discharge status: Home / Self Care | Payer: Medicare PPO | Attending: Ophthalmology | Admitting: Ophthalmology

## 2023-09-23 DIAGNOSIS — Z7984 Long term (current) use of oral hypoglycemic drugs: Secondary | ICD-10-CM | POA: Diagnosis not present

## 2023-09-23 DIAGNOSIS — H2512 Age-related nuclear cataract, left eye: Secondary | ICD-10-CM | POA: Insufficient documentation

## 2023-09-23 DIAGNOSIS — E1136 Type 2 diabetes mellitus with diabetic cataract: Secondary | ICD-10-CM | POA: Insufficient documentation

## 2023-09-23 DIAGNOSIS — I10 Essential (primary) hypertension: Secondary | ICD-10-CM | POA: Diagnosis not present

## 2023-09-23 HISTORY — DX: Bradycardia, unspecified: R00.1

## 2023-09-23 HISTORY — DX: Syncope and collapse: R55

## 2023-09-23 HISTORY — DX: Presence of external hearing-aid: Z97.4

## 2023-09-23 HISTORY — PX: CATARACT EXTRACTION W/PHACO: SHX586

## 2023-09-23 HISTORY — DX: Other supraventricular tachycardia: I47.19

## 2023-09-23 SURGERY — PHACOEMULSIFICATION, CATARACT, WITH IOL INSERTION
Anesthesia: Monitor Anesthesia Care | Laterality: Left

## 2023-09-23 MED ORDER — MOXIFLOXACIN HCL 0.5 % OP SOLN
OPHTHALMIC | Status: DC | PRN
  Administered 2023-09-23: .2 mL via OPHTHALMIC

## 2023-09-23 MED ORDER — SIGHTPATH DOSE#1 BSS IO SOLN
INTRAOCULAR | Status: DC | PRN
  Administered 2023-09-23: 102 mL via OPHTHALMIC

## 2023-09-23 MED ORDER — TETRACAINE HCL 0.5 % OP SOLN
OPHTHALMIC | Status: AC
  Filled 2023-09-23: qty 4

## 2023-09-23 MED ORDER — SIGHTPATH DOSE#1 NA HYALUR & NA CHOND-NA HYALUR IO KIT
PACK | INTRAOCULAR | Status: DC | PRN
  Administered 2023-09-23: 1 via OPHTHALMIC

## 2023-09-23 MED ORDER — ARMC OPHTHALMIC DILATING DROPS
1.0000 | OPHTHALMIC | Status: DC | PRN
  Administered 2023-09-23 (×3): 1 via OPHTHALMIC

## 2023-09-23 MED ORDER — LIDOCAINE HCL (PF) 2 % IJ SOLN
INTRAOCULAR | Status: DC | PRN
  Administered 2023-09-23: 1 mL via INTRAOCULAR

## 2023-09-23 MED ORDER — MIDAZOLAM HCL 2 MG/2ML IJ SOLN
INTRAMUSCULAR | Status: AC
Start: 2023-09-23 — End: ?
  Filled 2023-09-23: qty 2

## 2023-09-23 MED ORDER — ONDANSETRON HCL 4 MG/2ML IJ SOLN
INTRAMUSCULAR | Status: DC | PRN
  Administered 2023-09-23: 4 mg via INTRAVENOUS

## 2023-09-23 MED ORDER — FENTANYL CITRATE (PF) 100 MCG/2ML IJ SOLN
INTRAMUSCULAR | Status: DC | PRN
  Administered 2023-09-23: 50 ug via INTRAVENOUS

## 2023-09-23 MED ORDER — ARMC OPHTHALMIC DILATING DROPS
OPHTHALMIC | Status: AC
  Filled 2023-09-23: qty 0.5

## 2023-09-23 MED ORDER — SIGHTPATH DOSE#1 BSS IO SOLN
INTRAOCULAR | Status: DC | PRN
  Administered 2023-09-23: 15 mL via INTRAOCULAR

## 2023-09-23 MED ORDER — MIDAZOLAM HCL 2 MG/2ML IJ SOLN
INTRAMUSCULAR | Status: DC | PRN
  Administered 2023-09-23 (×2): 1 mg via INTRAVENOUS

## 2023-09-23 MED ORDER — ONDANSETRON HCL 4 MG/2ML IJ SOLN
INTRAMUSCULAR | Status: AC
  Filled 2023-09-23: qty 2

## 2023-09-23 MED ORDER — TETRACAINE HCL 0.5 % OP SOLN
1.0000 [drp] | OPHTHALMIC | Status: DC | PRN
  Administered 2023-09-23 (×3): 1 [drp] via OPHTHALMIC

## 2023-09-23 MED ORDER — FENTANYL CITRATE (PF) 100 MCG/2ML IJ SOLN
INTRAMUSCULAR | Status: AC
  Filled 2023-09-23: qty 2

## 2023-09-23 SURGICAL SUPPLY — 10 items
CATARACT SUITE SIGHTPATH (MISCELLANEOUS) ×1
DISSECTOR HYDRO NUCLEUS 50X22 (MISCELLANEOUS) ×2 IMPLANT
FEE CATARACT SUITE SIGHTPATH (MISCELLANEOUS) ×2 IMPLANT
GLOVE PI ULTRA LF STRL 7.5 (GLOVE) ×2 IMPLANT
GLOVE SURG POLYISOPRENE 8.5 (GLOVE) ×1
GLOVE SURG SYN 8.5 PF PI BL (GLOVE) ×2 IMPLANT
LENS IOL TECNIS EYHANCE 22.5 (Intraocular Lens) IMPLANT
NDL FILTER BLUNT 18X1 1/2 (NEEDLE) ×2 IMPLANT
NEEDLE FILTER BLUNT 18X1 1/2 (NEEDLE) ×1
SYR 3ML LL SCALE MARK (SYRINGE) ×2 IMPLANT

## 2023-09-23 NOTE — H&P (Signed)
 [facility]   Primary Care Physician:  Weyman Bright, MD Ophthalmologist: Dr. Adine Novak  Pre-Procedure History & Physical: HPI:  Whitney Bowers is a 78 y.o. female here for cataract surgery.   Past Medical History:  Diagnosis Date   Arthritis    Atrial tachycardia (HCC)    Diabetes mellitus without complication (HCC)    Dysplastic nevus 09/20/2009   Left mid volar forearm. Mild atypia.   Hyperglycemia    Hyperlipidemia    Hypertension    Hypothyroidism    Osteopenia    Palpitations    Pre-syncope    Sinus bradycardia    Wears hearing aid in both ears     Past Surgical History:  Procedure Laterality Date   CESAREAN SECTION     COLONOSCOPY WITH PROPOFOL  N/A 08/23/2016   Procedure: COLONOSCOPY WITH PROPOFOL ;  Surgeon: Rogelia Copping, MD;  Location: [facility];  Service: Endoscopy;  Laterality: N/A;  diabetic    COLONOSCOPY WITH PROPOFOL  N/A 04/24/2022   Procedure: COLONOSCOPY WITH PROPOFOL ;  Surgeon: Copping Rogelia, MD;  Location: [facility];  Service: Endoscopy;  Laterality: N/A;   MANDIBLE SURGERY     POLYPECTOMY N/A 08/23/2016   Procedure: POLYPECTOMY;  Surgeon: Rogelia Copping, MD;  Location: [facility];  Service: Endoscopy;  Laterality: N/A;    Prior to Admission medications   Medication Sig Start Date End Date Taking? Authorizing Provider  atorvastatin (LIPITOR) 10 MG tablet Take 10 mg by mouth daily.    Yes [provider]  Cholecalciferol (VITAMIN D3) 1.25 MG (50000 UT) TABS Take by mouth once a week.   Yes [provider]  flecainide  (TAMBOCOR ) 50 MG tablet Take 0.5 tablets (25 mg total) by mouth 2 (two) times daily. Patient taking differently: Take 25 mg by mouth as needed. 10/25/22  Yes Fernande Elspeth BROCKS, MD  metFORMIN (GLUCOPHAGE) 500 MG tablet Takes 2 tablets am and 1 tablet pm daily. 05/15/18  Yes [provider]  metoprolol tartrate (LOPRESSOR) 25 MG tablet Take 25 mg by mouth 2 (two) times daily.   Yes [provider]    Allergies as of 08/05/2023 - Review Complete 10/25/2022  Allergen Reaction Noted   Penicillins  02/16/2016    Family History  Problem Relation Age of Onset   Breast cancer Maternal Aunt 59   Hypertension Father    Stroke Father     Social History   Socioeconomic History   Marital status: Widowed    Spouse name: Not on file   Number of children: Not on file   Years of education: Not on file   Highest education level: Not on file  Occupational History   Not on file  Tobacco Use   Smoking status: Never   Smokeless tobacco: Never  Vaping Use   Vaping status: Never Used  Substance and Sexual Activity   Alcohol use: Yes    Alcohol/week: 7.0 standard drinks of alcohol    Types: 7 Glasses of wine per week    Comment: wine   Drug use: No   Sexual activity: Not on file  Other Topics Concern   Not on file  Social History Narrative   Not on file   Social Drivers of Health   Financial Resource Strain: Not on file  Food Insecurity: Not on file  Transportation Needs: Not on file  Physical Activity: Not on file  Stress: Not on file  Social Connections: Not on file  Intimate Partner Violence: Not on file  Review of Systems: See HPI, otherwise negative ROS  Physical Exam: BP (!) 143/80   Pulse 70   Temp 98.4 F (36.9 C)   Resp 11   Ht 5' 7.99 (1.727 m)   Wt 64.3 kg   SpO2 96%   BMI 21.55 kg/m  General:   Alert, cooperative in NAD Head:  Normocephalic and atraumatic. Respiratory:  Normal work of breathing. Cardiovascular:  RRR  Impression/Plan: Whitney Bowers is here for cataract surgery.  Risks, benefits, limitations, and alternatives regarding cataract surgery have been reviewed with the patient.  Questions have been answered.  All parties agreeable.   Adine Novak, MD  09/23/2023, 7:20 AM

## 2023-09-23 NOTE — Anesthesia Postprocedure Evaluation (Signed)
 Anesthesia Post Note  Patient: Whitney Bowers  Procedure(s) Performed: CATARACT EXTRACTION PHACO AND INTRAOCULAR LENS PLACEMENT (IOC) LEFT DIABETIC 10.63 00:57.6 (Left)  Patient location during evaluation: PACU Anesthesia Type: MAC Level of consciousness: awake and alert Pain management: pain level controlled Vital Signs Assessment: post-procedure vital signs reviewed and stable Respiratory status: spontaneous breathing, nonlabored ventilation, respiratory function stable and patient connected to nasal cannula oxygen Cardiovascular status: stable and blood pressure returned to baseline Postop Assessment: no apparent nausea or vomiting Anesthetic complications: no   No notable events documented.   Last Vitals:  Vitals:   09/23/23 0745 09/23/23 0754  BP: 124/78   Pulse:    Resp: 14   Temp: (!) 36.1 C 36.6 C  SpO2: 96%     Last Pain:  Vitals:   09/23/23 0754  PainSc: 0-No pain                 Britaney Espaillat C Alantra Popoca

## 2023-09-23 NOTE — Transfer of Care (Signed)
 Immediate Anesthesia Transfer of Care Note  Patient: Whitney Bowers  Procedure(s) Performed: CATARACT EXTRACTION PHACO AND INTRAOCULAR LENS PLACEMENT (IOC) LEFT DIABETIC 10.63 00:57.6 (Left)  Patient Location: PACU  Anesthesia Type: MAC  Level of Consciousness: awake, alert  and patient cooperative  Airway and Oxygen Therapy: Patient Spontanous Breathing and Patient connected to supplemental oxygen  Post-op Assessment: Post-op Vital signs reviewed, Patient's Cardiovascular Status Stable, Respiratory Function Stable, Patent Airway and No signs of Nausea or vomiting  Post-op Vital Signs: Reviewed and stable  Complications: No notable events documented.

## 2023-09-23 NOTE — Op Note (Signed)
 OPERATIVE NOTE  Whitney Bowers [id number] 09/23/2023   PREOPERATIVE DIAGNOSIS:  Nuclear sclerotic cataract left eye.  H25.12   POSTOPERATIVE DIAGNOSIS:    Nuclear sclerotic cataract left eye.     PROCEDURE:  Phacoemusification with posterior chamber intraocular lens placement of the left eye   LENS:   Implant Name Type Inv. Item Serial No. Manufacturer Lot No. LRB No. Used Action  LENS IOL TECNIS EYHANCE 22.5 - [id number] Intraocular Lens LENS IOL TECNIS EYHANCE 22.5 [id number] [employer]  Left 1 Implanted      Procedure(s): CATARACT EXTRACTION PHACO AND INTRAOCULAR LENS PLACEMENT (IOC) LEFT DIABETIC 10.63 00:57.6 (Left)  SURGEON:  Adine Novak, MD, MPH   ANESTHESIA:  Topical with tetracaine  drops augmented with 1% preservative-free intracameral lidocaine .  ESTIMATED BLOOD LOSS: <1 mL   COMPLICATIONS:  None.   DESCRIPTION OF PROCEDURE:  The patient was identified in the holding room and transported to the operating room and placed in the supine position under the operating microscope.  The left eye was identified as the operative eye and it was prepped and draped in the usual sterile ophthalmic fashion.   A 1.0 millimeter clear-corneal paracentesis was made at the 5:00 position. 0.5 ml of preservative-free 1% lidocaine  with epinephrine  was injected into the anterior chamber.  The anterior chamber was filled with viscoelastic.  A 2.4 millimeter keratome was used to make a near-clear corneal incision at the 2:00 position.  A curvilinear capsulorrhexis was made with a cystotome and capsulorrhexis forceps.  Balanced salt  solution was used to hydrodissect and hydrodelineate the nucleus.   Phacoemulsification was then used in stop and chop fashion to remove the lens nucleus and epinucleus.  The remaining cortex was then removed using the irrigation and aspiration handpiece. Viscoelastic was then placed into the capsular bag to distend it for lens placement.  A lens was then injected into the  capsular bag.  The remaining viscoelastic was aspirated.   Wounds were hydrated with balanced salt  solution.  The anterior chamber was inflated to a physiologic pressure with balanced salt  solution.  Intracameral vigamox  0.1 mL undiltued was injected into the eye and a drop placed onto the ocular surface.  No wound leaks were noted.  The patient was taken to the recovery room in stable condition without complications of anesthesia or surgery  Adine Novak 09/23/2023, 7:47 AM

## 2023-09-24 ENCOUNTER — Other Ambulatory Visit: Payer: Self-pay

## 2023-09-24 ENCOUNTER — Encounter: Payer: Self-pay | Admitting: Ophthalmology

## 2023-10-03 NOTE — Discharge Instructions (Signed)

## 2023-10-07 ENCOUNTER — Ambulatory Visit: Payer: Self-pay | Admitting: Anesthesiology

## 2023-10-07 ENCOUNTER — Other Ambulatory Visit: Payer: Self-pay

## 2023-10-07 ENCOUNTER — Ambulatory Visit
Admission: RE | Admit: 2023-10-07 | Discharge: 2023-10-07 | Disposition: A | Discharge status: Home / Self Care | Payer: Medicare PPO | Attending: Ophthalmology | Admitting: Ophthalmology

## 2023-10-07 ENCOUNTER — Encounter
Admission: RE | Disposition: A | Discharge status: Home / Self Care | Payer: Self-pay | Source: Home / Self Care | Attending: Ophthalmology

## 2023-10-07 DIAGNOSIS — H2511 Age-related nuclear cataract, right eye: Secondary | ICD-10-CM | POA: Insufficient documentation

## 2023-10-07 DIAGNOSIS — E1136 Type 2 diabetes mellitus with diabetic cataract: Secondary | ICD-10-CM | POA: Insufficient documentation

## 2023-10-07 DIAGNOSIS — Z7984 Long term (current) use of oral hypoglycemic drugs: Secondary | ICD-10-CM | POA: Diagnosis not present

## 2023-10-07 DIAGNOSIS — I10 Essential (primary) hypertension: Secondary | ICD-10-CM | POA: Insufficient documentation

## 2023-10-07 HISTORY — PX: CATARACT EXTRACTION W/PHACO: SHX586

## 2023-10-07 LAB — GLUCOSE, CAPILLARY: Glucose-Capillary: 106 mg/dL — ABNORMAL HIGH (ref 70–99)

## 2023-10-07 SURGERY — PHACOEMULSIFICATION, CATARACT, WITH IOL INSERTION
Anesthesia: Monitor Anesthesia Care | Laterality: Right

## 2023-10-07 MED ORDER — MOXIFLOXACIN HCL 0.5 % OP SOLN
OPHTHALMIC | Status: DC | PRN
  Administered 2023-10-07: .2 mL via OPHTHALMIC

## 2023-10-07 MED ORDER — MIDAZOLAM HCL 2 MG/2ML IJ SOLN
INTRAMUSCULAR | Status: DC | PRN
  Administered 2023-10-07: 2 mg via INTRAVENOUS

## 2023-10-07 MED ORDER — TETRACAINE HCL 0.5 % OP SOLN
1.0000 [drp] | OPHTHALMIC | Status: DC | PRN
  Administered 2023-10-07 (×3): 1 [drp] via OPHTHALMIC

## 2023-10-07 MED ORDER — ARMC OPHTHALMIC DILATING DROPS
1.0000 | OPHTHALMIC | Status: DC | PRN
  Administered 2023-10-07 (×3): 1 via OPHTHALMIC

## 2023-10-07 MED ORDER — FENTANYL CITRATE (PF) 100 MCG/2ML IJ SOLN
INTRAMUSCULAR | Status: AC
  Filled 2023-10-07: qty 2

## 2023-10-07 MED ORDER — MIDAZOLAM HCL 2 MG/2ML IJ SOLN
INTRAMUSCULAR | Status: AC
Start: 2023-10-07 — End: ?
  Filled 2023-10-07: qty 2

## 2023-10-07 MED ORDER — LIDOCAINE HCL (PF) 2 % IJ SOLN
INTRAOCULAR | Status: DC | PRN
  Administered 2023-10-07: 1 mL via INTRAOCULAR

## 2023-10-07 MED ORDER — TETRACAINE HCL 0.5 % OP SOLN
OPHTHALMIC | Status: AC
  Filled 2023-10-07: qty 4

## 2023-10-07 MED ORDER — SIGHTPATH DOSE#1 BSS IO SOLN
INTRAOCULAR | Status: DC | PRN
  Administered 2023-10-07: 15 mL via INTRAOCULAR

## 2023-10-07 MED ORDER — SIGHTPATH DOSE#1 NA HYALUR & NA CHOND-NA HYALUR IO KIT
PACK | INTRAOCULAR | Status: DC | PRN
  Administered 2023-10-07: 1 via OPHTHALMIC

## 2023-10-07 MED ORDER — ARMC OPHTHALMIC DILATING DROPS
OPHTHALMIC | Status: AC
  Filled 2023-10-07: qty 0.5

## 2023-10-07 MED ORDER — SIGHTPATH DOSE#1 BSS IO SOLN
INTRAOCULAR | Status: DC | PRN
  Administered 2023-10-07: 89 mL via OPHTHALMIC

## 2023-10-07 MED ORDER — FENTANYL CITRATE (PF) 100 MCG/2ML IJ SOLN
INTRAMUSCULAR | Status: DC | PRN
  Administered 2023-10-07: 50 ug via INTRAVENOUS

## 2023-10-07 SURGICAL SUPPLY — 10 items
CATARACT SUITE SIGHTPATH (MISCELLANEOUS) ×1 IMPLANT
DISSECTOR HYDRO NUCLEUS 50X22 (MISCELLANEOUS) ×2 IMPLANT
FEE CATARACT SUITE SIGHTPATH (MISCELLANEOUS) ×2 IMPLANT
GLOVE PI ULTRA LF STRL 7.5 (GLOVE) ×2 IMPLANT
GLOVE SURG POLYISOPRENE 8.5 (GLOVE) ×1 IMPLANT
GLOVE SURG SYN 8.5 PF PI BL (GLOVE) ×2 IMPLANT
LENS IOL TECNIS EYHANCE 23.0 (Intraocular Lens) IMPLANT
NDL FILTER BLUNT 18X1 1/2 (NEEDLE) ×2 IMPLANT
NEEDLE FILTER BLUNT 18X1 1/2 (NEEDLE) ×1 IMPLANT
SYR 3ML LL SCALE MARK (SYRINGE) ×2 IMPLANT

## 2023-10-07 NOTE — Op Note (Signed)
OPERATIVE NOTE  Whitney Bowers [id number] 10/07/2023   PREOPERATIVE DIAGNOSIS:  Nuclear sclerotic cataract right eye.  H25.11   POSTOPERATIVE DIAGNOSIS:    Nuclear sclerotic cataract right eye.     PROCEDURE:  Phacoemusification with posterior chamber intraocular lens placement of the right eye   LENS:   Implant Name Type Inv. Item Serial No. Manufacturer Lot No. LRB No. Used Action  LENS IOL TECNIS EYHANCE 23.0 - [id number] Intraocular Lens LENS IOL TECNIS EYHANCE 23.0 [id number] [employer]  Right 1 Implanted       Procedure(s): CATARACT EXTRACTION PHACO AND INTRAOCULAR LENS PLACEMENT (IOC) RIGHT DIABETIC 9.37, 00:53.8 (Right)  SURGEON:  Willey Blade, MD, MPH  ANESTHESIOLOGIST: Anesthesiologist: Foye Deer, MD CRNA: Irving Burton, CRNA   ANESTHESIA:  Topical with tetracaine drops augmented with 1% preservative-free intracameral lidocaine.  ESTIMATED BLOOD LOSS: less than 1 mL.   COMPLICATIONS:  None.   DESCRIPTION OF PROCEDURE:  The patient was identified in the holding room and transported to the operating room and placed in the supine position under the operating microscope.  The right eye was identified as the operative eye and it was prepped and draped in the usual sterile ophthalmic fashion.   A 1.0 millimeter clear-corneal paracentesis was made at the 10:30 position. 0.5 ml of preservative-free 1% lidocaine with epinephrine was injected into the anterior chamber.  The anterior chamber was filled with viscoelastic.  A 2.4 millimeter keratome was used to make a near-clear corneal incision at the 8:00 position.  A curvilinear capsulorrhexis was made with a cystotome and capsulorrhexis forceps.  Balanced salt solution was used to hydrodissect and hydrodelineate the nucleus.   Phacoemulsification was then used in stop and chop fashion to remove the lens nucleus and epinucleus.  The remaining cortex was then removed using the irrigation and aspiration handpiece.  Viscoelastic was then placed into the capsular bag to distend it for lens placement.  A lens was then injected into the capsular bag.  The remaining viscoelastic was aspirated.   Wounds were hydrated with balanced salt solution.  The anterior chamber was inflated to a physiologic pressure with balanced salt solution.   Intracameral vigamox 0.1 mL undiluted was injected into the eye and a drop placed onto the ocular surface.  No wound leaks were noted.  The patient was taken to the recovery room in stable condition without complications of anesthesia or surgery  Willey Blade 10/07/2023, 7:51 AM

## 2023-10-07 NOTE — H&P (Signed)
[facility]   Primary Care Physician:  Sherrie Mustache, MD Ophthalmologist: Dr. Willey Blade  Pre-Procedure History & Physical: HPI:  Whitney Bowers is a 78 y.o. female here for cataract surgery.   Past Medical History:  Diagnosis Date   Arthritis    Atrial tachycardia (HCC)    Diabetes mellitus without complication (HCC)    Dysplastic nevus 09/20/2009   Left mid volar forearm. Mild atypia.   Hyperglycemia    Hyperlipidemia    Hypertension    Hypothyroidism    Osteopenia    Palpitations    Pre-syncope    Sinus bradycardia    Wears hearing aid in both ears     Past Surgical History:  Procedure Laterality Date   CATARACT EXTRACTION W/PHACO Left 09/23/2023   Procedure: CATARACT EXTRACTION PHACO AND INTRAOCULAR LENS PLACEMENT (IOC) LEFT DIABETIC 10.63 00:57.6;  Surgeon: Nevada Crane, MD;  Location: [facility];  Service: Ophthalmology;  Laterality: Left;   CESAREAN SECTION     COLONOSCOPY WITH PROPOFOL N/A 08/23/2016   Procedure: COLONOSCOPY WITH PROPOFOL;  Surgeon: Midge Minium, MD;  Location: [facility];  Service: Endoscopy;  Laterality: N/A;  diabetic    COLONOSCOPY WITH PROPOFOL N/A 04/24/2022   Procedure: COLONOSCOPY WITH PROPOFOL;  Surgeon: Midge Minium, MD;  Location: [facility];  Service: Endoscopy;  Laterality: N/A;   MANDIBLE SURGERY     POLYPECTOMY N/A 08/23/2016   Procedure: POLYPECTOMY;  Surgeon: Midge Minium, MD;  Location: [facility];  Service: Endoscopy;  Laterality: N/A;    Prior to Admission medications   Medication Sig Start Date End Date Taking? Authorizing Provider  atorvastatin (LIPITOR) 10 MG tablet Take 10 mg by mouth daily.    Yes [provider]  Cholecalciferol (VITAMIN D3) 1.25 MG (50000 UT) TABS Take by mouth once a week.   Yes [provider]  flecainide (TAMBOCOR) 50 MG tablet Take 0.5 tablets (25 mg total) by mouth 2 (two) times daily. Patient taking differently: Take 25 mg by mouth as  needed. 10/25/22  Yes Duke Salvia, MD  metFORMIN (GLUCOPHAGE) 500 MG tablet Takes 2 tablets am and 1 tablet pm daily. 05/15/18  Yes [provider]  metoprolol tartrate (LOPRESSOR) 25 MG tablet Take 25 mg by mouth 2 (two) times daily.   Yes [provider]    Allergies as of 08/05/2023 - Review Complete 10/25/2022  Allergen Reaction Noted   Penicillins  02/16/2016    Family History  Problem Relation Age of Onset   Breast cancer Maternal Aunt 86   Hypertension Father    Stroke Father     Social History   Socioeconomic History   Marital status: Widowed    Spouse name: Not on file   Number of children: Not on file   Years of education: Not on file   Highest education level: Not on file  Occupational History   Not on file  Tobacco Use   Smoking status: Never   Smokeless tobacco: Never  Vaping Use   Vaping status: Never Used  Substance and Sexual Activity   Alcohol use: Yes    Alcohol/week: 7.0 standard drinks of alcohol    Types: 7 Glasses of wine per week    Comment: wine   Drug use: No   Sexual activity: Not on file  Other Topics Concern   Not on file  Social History Narrative   Not on file   Social Drivers of Health   Financial Resource Strain: Not on  file  Food Insecurity: Not on file  Transportation Needs: Not on file  Physical Activity: Not on file  Stress: Not on file  Social Connections: Not on file  Intimate Partner Violence: Not on file    Review of Systems: See HPI, otherwise negative ROS  Physical Exam: BP 126/80   Pulse (!) 58   Temp 99.5 F (37.5 C) (Temporal)   Resp 14   Wt 63.5 kg   SpO2 95%   BMI 21.29 kg/m  General:   Alert, cooperative in NAD Head:  Normocephalic and atraumatic. Respiratory:  Normal work of breathing. Cardiovascular:  RRR  Impression/Plan: Whitney Bowers is here for cataract surgery.  Risks, benefits, limitations, and alternatives regarding cataract surgery have been reviewed with the  patient.  Questions have been answered.  All parties agreeable.   Willey Blade, MD  10/07/2023, 7:19 AM

## 2023-10-07 NOTE — Transfer of Care (Signed)
Immediate Anesthesia Transfer of Care Note  Patient: Whitney Bowers  Procedure(s) Performed: CATARACT EXTRACTION PHACO AND INTRAOCULAR LENS PLACEMENT (IOC) RIGHT DIABETIC 9.37, 00:53.8 (Right)  Patient Location: PACU  Anesthesia Type: MAC  Level of Consciousness: awake, alert  and patient cooperative  Airway and Oxygen Therapy: Patient Spontanous Breathing and Patient connected to supplemental oxygen  Post-op Assessment: Post-op Vital signs reviewed, Patient's Cardiovascular Status Stable, Respiratory Function Stable, Patent Airway and No signs of Nausea or vomiting  Post-op Vital Signs: Reviewed and stable  Complications: No notable events documented.

## 2023-10-07 NOTE — Anesthesia Preprocedure Evaluation (Signed)
Anesthesia Evaluation  Patient identified by MRN, date of birth, ID band Patient awake    Reviewed: Allergy & Precautions, H&P , NPO status , Patient's Chart, lab work & pertinent test results  Airway Mallampati: III  TM Distance: >3 FB Neck ROM: Full    Dental no notable dental hx.    Pulmonary neg pulmonary ROS   Pulmonary exam normal breath sounds clear to auscultation       Cardiovascular hypertension, negative cardio ROS Normal cardiovascular exam Rhythm:Regular Rate:Normal  Negative stress test 09-04-18   Neuro/Psych negative neurological ROS  negative psych ROS   GI/Hepatic negative GI ROS, Neg liver ROS,,,  Endo/Other  negative endocrine ROSdiabetesHypothyroidism    Renal/GU negative Renal ROS  negative genitourinary   Musculoskeletal negative musculoskeletal ROS (+) Arthritis ,    Abdominal   Peds negative pediatric ROS (+)  Hematology negative hematology ROS (+)   Anesthesia Other Findings Previous hx colonoscopy procedure Hypertension  Palpitations Arthritis  Diabetes mellitus without complication Osteopenia  Hyperlipidemia Hyperglycemia  Hypothyroidism Dysplastic nevus  Wears hearing aid in both ears    Reproductive/Obstetrics negative OB ROS                              Anesthesia Physical Anesthesia Plan  ASA: 2  Anesthesia Plan: MAC   Post-op Pain Management:    Induction: Intravenous  PONV Risk Score and Plan:   Airway Management Planned: Natural Airway and Nasal Cannula  Additional Equipment:   Intra-op Plan:   Post-operative Plan:   Informed Consent: I have reviewed the patients History and Physical, chart, labs and discussed the procedure including the risks, benefits and alternatives for the proposed anesthesia with the patient or authorized representative who has indicated his/her understanding and acceptance.       Plan Discussed with:  Anesthesiologist, CRNA and Surgeon  Anesthesia Plan Comments:          Anesthesia Quick Evaluation

## 2023-10-07 NOTE — Anesthesia Postprocedure Evaluation (Signed)
Anesthesia Post Note  Patient: Whitney Bowers  Procedure(s) Performed: CATARACT EXTRACTION PHACO AND INTRAOCULAR LENS PLACEMENT (IOC) RIGHT DIABETIC 9.37, 00:53.8 (Right)  Patient location during evaluation: PACU Anesthesia Type: MAC Level of consciousness: awake and alert Pain management: pain level controlled Vital Signs Assessment: post-procedure vital signs reviewed and stable Respiratory status: spontaneous breathing, nonlabored ventilation and respiratory function stable Cardiovascular status: stable and blood pressure returned to baseline Postop Assessment: no apparent nausea or vomiting Anesthetic complications: no   No notable events documented.   Last Vitals:  Vitals:   10/07/23 0753 10/07/23 0802  BP: 116/74 111/76  Pulse: 62 (!) 57  Resp: 14   Temp: (!) 36.3 C   SpO2: 94% 95%    Last Pain:  Vitals:   10/07/23 0802  TempSrc:   PainSc: 0-No pain                 Foye Deer

## 2023-10-08 ENCOUNTER — Encounter: Payer: Self-pay | Admitting: Ophthalmology

## 2023-11-30 IMAGING — MG MM DIGITAL SCREENING BILAT W/ TOMO AND CAD
6 of 10 series · 6 of 30 positions shown · non-contrast
Comparison: Previous exam(s).

CLINICAL DATA: Screening.

EXAM:
DIGITAL SCREENING BILATERAL MAMMOGRAM WITH TOMOSYNTHESIS AND CAD
TECHNIQUE: Bilateral screening digital craniocaudal and mediolateral oblique
mammograms were obtained. Bilateral screening digital breast
tomosynthesis was performed. The images were evaluated with
computer-aided detection.

[R CC synth-2D]
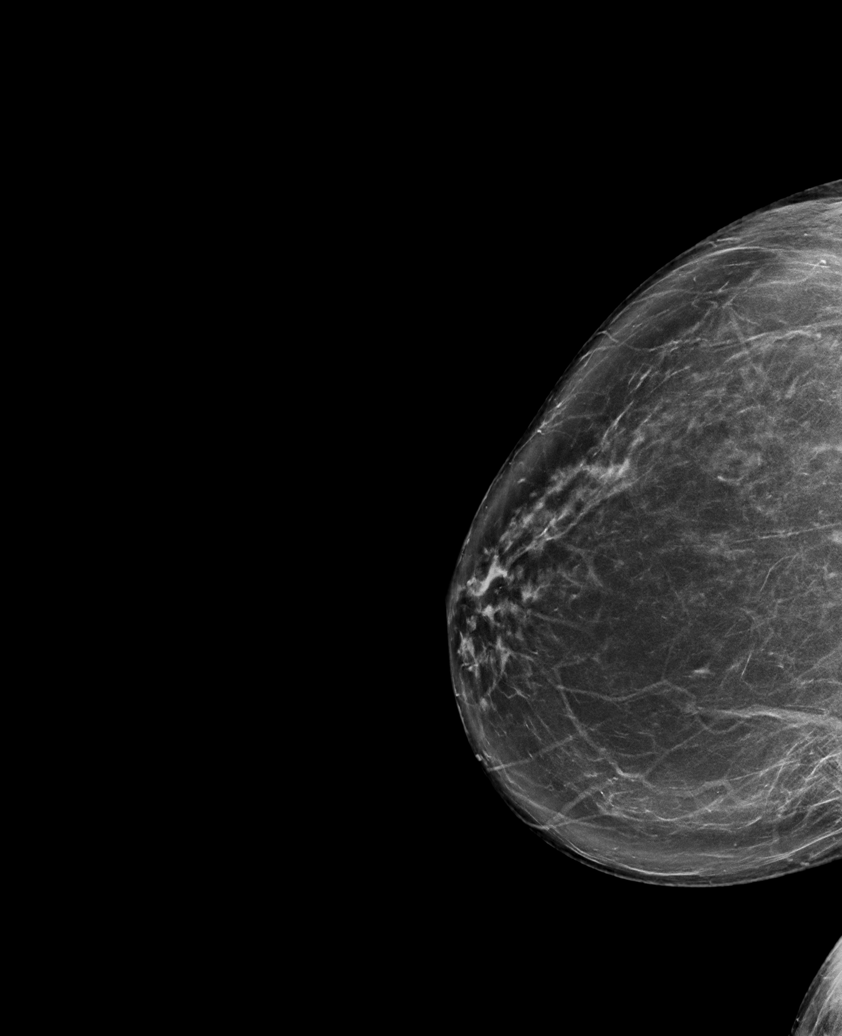

[L CC synth-2D]
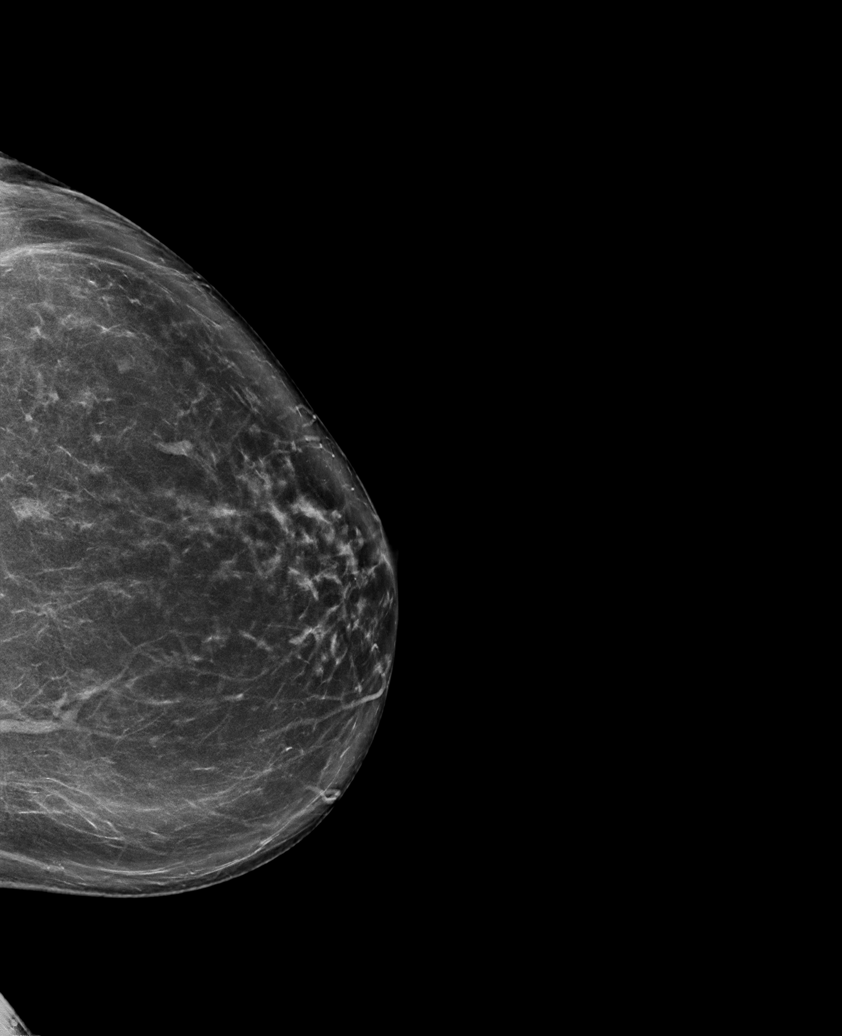

[R MLO synth-2D (1 of 2)]
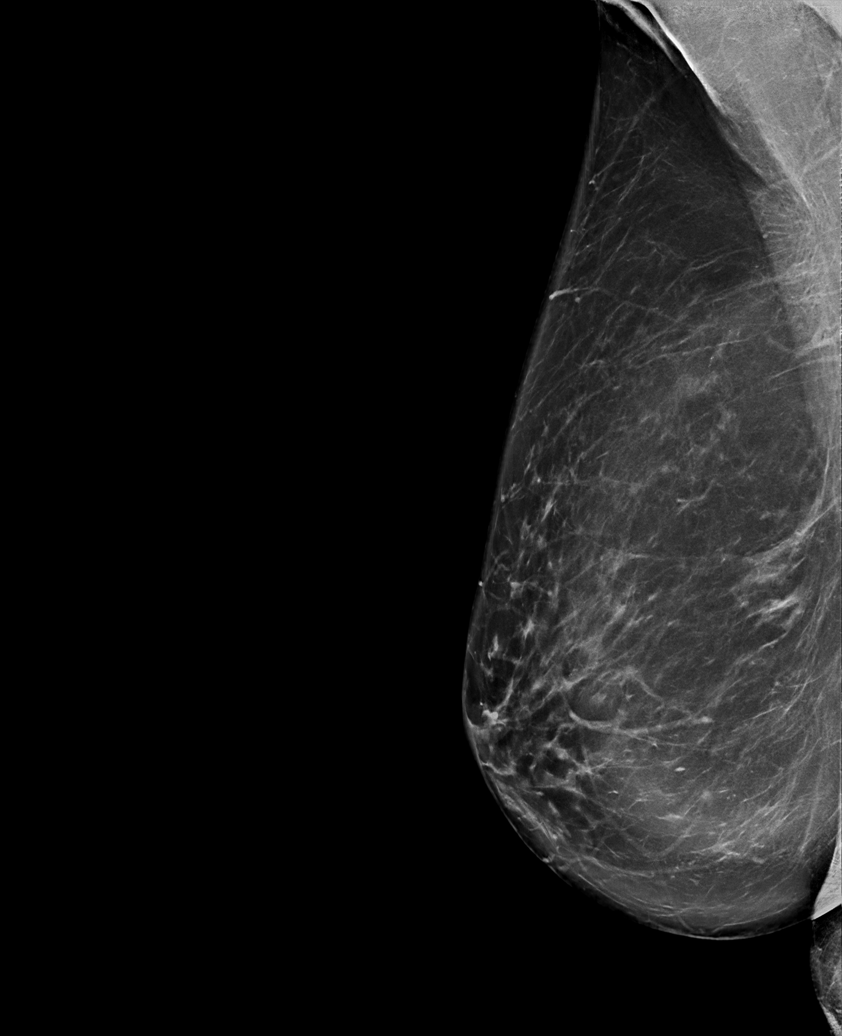

[R MLO synth-2D (2 of 2)]
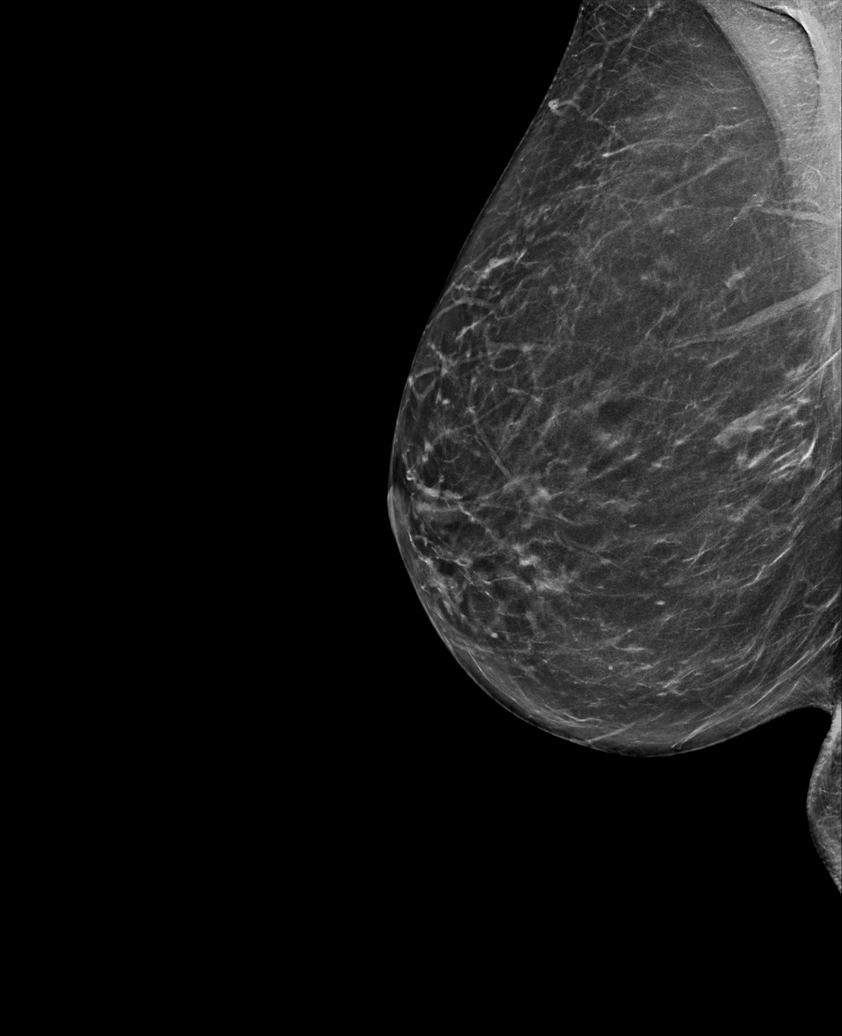

[L MLO synth-2D]
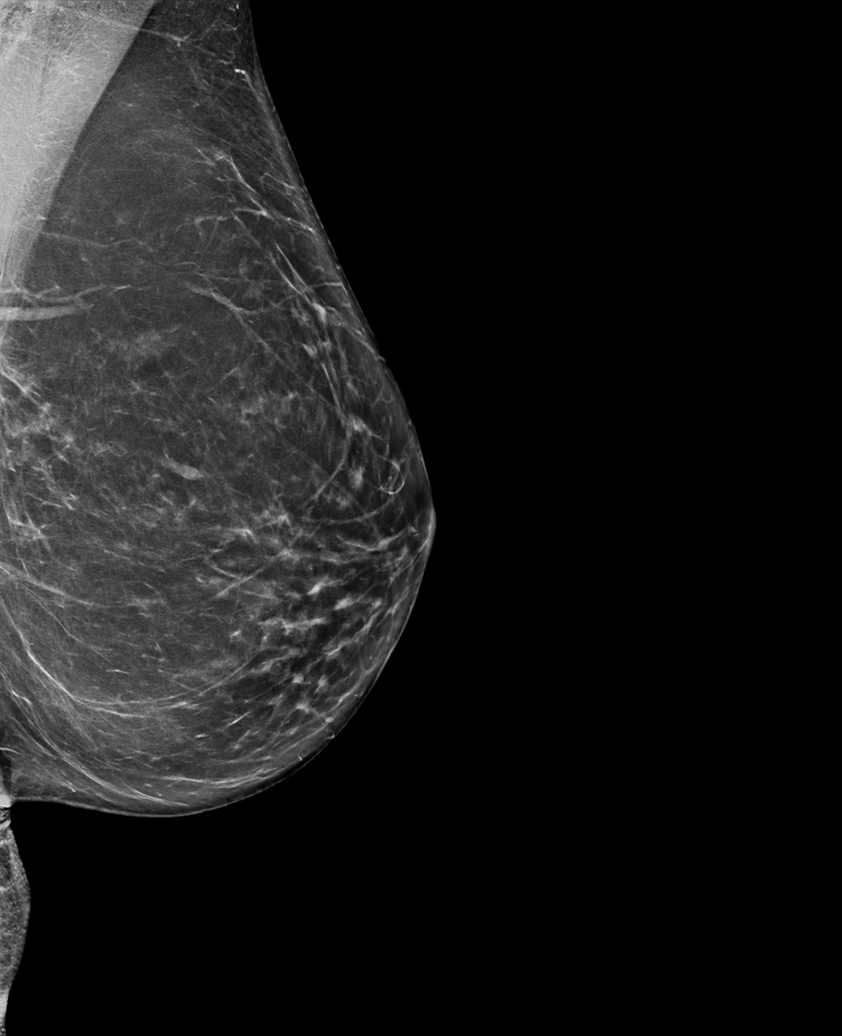

[R MLO tomo · tomo slice 34/67.0]
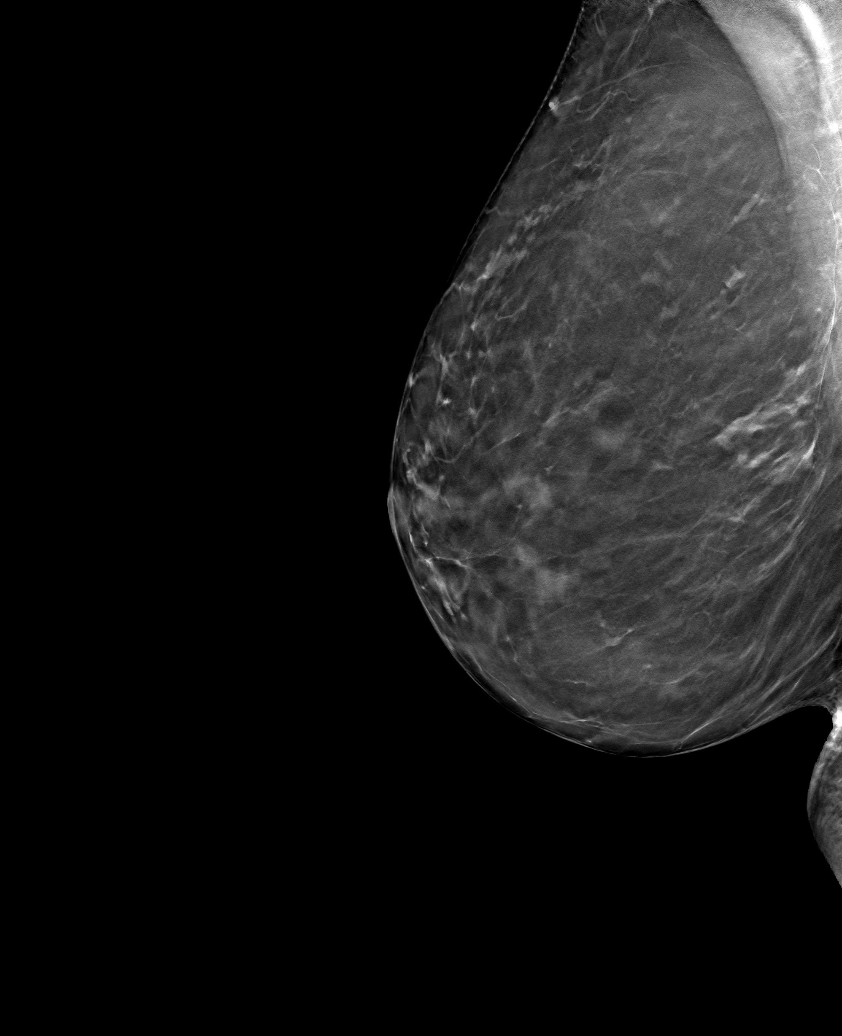

[6 of 30 positions shown; findings below may reference images not displayed]

ACR Breast Density Category b: There are scattered areas of
fibroglandular density.
FINDINGS: There are no findings suspicious for malignancy.
IMPRESSION: No mammographic evidence of malignancy. A result letter of this
screening mammogram will be mailed directly to the patient.

RECOMMENDATION:
Screening mammogram in one year. (Code:51-O-LD2)

BI-RADS CATEGORY  1: Negative.

## 2023-12-10 ENCOUNTER — Inpatient Hospital Stay: Attending: Oncology | Admitting: Oncology

## 2023-12-10 ENCOUNTER — Inpatient Hospital Stay

## 2023-12-10 ENCOUNTER — Encounter: Payer: Self-pay | Admitting: Oncology

## 2023-12-10 VITALS — BP 121/66 | HR 65 | Temp 97.6°F | Resp 18 | Wt 144.0 lb

## 2023-12-10 DIAGNOSIS — D473 Essential (hemorrhagic) thrombocythemia: Secondary | ICD-10-CM | POA: Insufficient documentation

## 2023-12-10 DIAGNOSIS — D75839 Thrombocytosis, unspecified: Secondary | ICD-10-CM

## 2023-12-10 LAB — IRON AND TIBC
Iron: 57 ug/dL (ref 28–170)
Saturation Ratios: 14 % (ref 10.4–31.8)
TIBC: 398 ug/dL (ref 250–450)
UIBC: 341 ug/dL

## 2023-12-10 LAB — FERRITIN: Ferritin: 20 ng/mL (ref 11–307)

## 2023-12-10 LAB — CBC (CANCER CENTER ONLY)
HCT: 42 % (ref 36.0–46.0)
Hemoglobin: 13.6 g/dL (ref 12.0–15.0)
MCH: 29.4 pg (ref 26.0–34.0)
MCHC: 32.4 g/dL (ref 30.0–36.0)
MCV: 90.9 fL (ref 80.0–100.0)
Platelet Count: 525 10*3/uL — ABNORMAL HIGH (ref 150–400)
RBC: 4.62 MIL/uL (ref 3.87–5.11)
RDW: 14.8 % (ref 11.5–15.5)
WBC Count: 7.1 10*3/uL (ref 4.0–10.5)
nRBC: 0 % (ref 0.0–0.2)

## 2023-12-10 NOTE — Progress Notes (Unsigned)
 [facility]  [phone] [phone]  ID: Whitney Bowers OB: 05-Mar-1946  MR#: [id number]  AOZ#:[id number]  Patient Care Team: Sherrie Mustache, MD as PCP - General (Internal Medicine) Jeralyn Ruths, MD as Consulting Physician (Hematology and Oncology)  CHIEF COMPLAINT: Thrombocytosis.  INTERVAL HISTORY: Patient is a 78 year old female who was noted to have a persistently elevated platelet count on routine blood work.  She currently feels well and is asymptomatic.  She has no neurologic complaints.  She denies any recent fevers or illnesses.  She has a good appetite and denies weight loss.  She has no chest pain, shortness of breath, cough, or hemoptysis.  She denies any nausea, vomiting, constipation, or diarrhea.  She has no urinary complaints.  Patient offers no specific complaints today.  REVIEW OF SYSTEMS:   Review of Systems  Constitutional: Negative.  Negative for fever, malaise/fatigue and weight loss.  Respiratory: Negative.  Negative for cough, hemoptysis and shortness of breath.   Cardiovascular: Negative.  Negative for chest pain and leg swelling.  Gastrointestinal:  Negative for abdominal pain.  Genitourinary: Negative.  Negative for dysuria.  Musculoskeletal: Negative.  Negative for back pain.  Skin: Negative.  Negative for rash.  Neurological: Negative.  Negative for dizziness, focal weakness, weakness and headaches.  Psychiatric/Behavioral: Negative.  The patient is not nervous/anxious.     As per HPI. Otherwise, a complete review of systems is negative.  PAST MEDICAL HISTORY: Past Medical History:  Diagnosis Date   Arthritis    Atrial tachycardia (HCC)    Diabetes mellitus without complication (HCC)    Dysplastic nevus 09/20/2009   Left mid volar forearm. Mild atypia.   Hyperglycemia    Hyperlipidemia    Hypertension    Hypothyroidism    Osteopenia    Palpitations    Pre-syncope    Sinus bradycardia    Wears  hearing aid in both ears     PAST SURGICAL HISTORY: Past Surgical History:  Procedure Laterality Date   CATARACT EXTRACTION W/PHACO Left 09/23/2023   Procedure: CATARACT EXTRACTION PHACO AND INTRAOCULAR LENS PLACEMENT (IOC) LEFT DIABETIC 10.63 00:57.6;  Surgeon: Nevada Crane, MD;  Location: [facility];  Service: Ophthalmology;  Laterality: Left;   CATARACT EXTRACTION W/PHACO Right 10/07/2023   Procedure: CATARACT EXTRACTION PHACO AND INTRAOCULAR LENS PLACEMENT (IOC) RIGHT DIABETIC 9.37, 00:53.8;  Surgeon: Nevada Crane, MD;  Location: [facility];  Service: Ophthalmology;  Laterality: Right;   CESAREAN SECTION     1979 and 1982   COLONOSCOPY WITH PROPOFOL N/A 08/23/2016   Procedure: COLONOSCOPY WITH PROPOFOL;  Surgeon: Midge Minium, MD;  Location: [facility];  Service: Endoscopy;  Laterality: N/A;  diabetic    COLONOSCOPY WITH PROPOFOL N/A 04/24/2022   Procedure: COLONOSCOPY WITH PROPOFOL;  Surgeon: Midge Minium, MD;  Location: [facility] END[facility]COPY;  Service: Endoscopy;  Laterality: N/A;   MANDIBLE SURGERY     POLYPECTOMY N/A 08/23/2016   Procedure: POLYPECTOMY;  Surgeon: Midge Minium, MD;  Location: [facility];  Service: Endoscopy;  Laterality: N/A;    FAMILY HISTORY: Family History  Problem Relation Age of Onset   Atrial fibrillation Mother    Hyperlipidemia Mother    Hypertension Mother    Hypertension Father    Stroke Father    Breast cancer Maternal Aunt 40   Breast cancer Niece     ADVANCED DIRECTIVES (Y/N):  N  HEALTH MAINTENANCE: Social History   Tobacco Use   Smoking status: Never   Smokeless tobacco:  Never  Vaping Use   Vaping status: Never Used  Substance Use Topics   Alcohol use: Yes    Alcohol/week: 7.0 standard drinks of alcohol    Types: 7 Glasses of wine per week    Comment: wine   Drug use: No     Colonoscopy:  PAP:  Bone density:  Lipid panel:  Allergies  Allergen Reactions   Penicillins     Blistered  face   Tape     blisters    Current Outpatient Medications  Medication Sig Dispense Refill   atorvastatin (LIPITOR) 10 MG tablet Take 10 mg by mouth daily.      flecainide (TAMBOCOR) 50 MG tablet Take 0.5 tablets (25 mg total) by mouth 2 (two) times daily. (Patient taking differently: Take 25 mg by mouth as needed.) 180 tablet 1   metFORMIN (GLUCOPHAGE) 500 MG tablet Takes 2 tablets am and 1 tablet pm daily.     metoprolol tartrate (LOPRESSOR) 25 MG tablet Take 25 mg by mouth 2 (two) times daily.     No current facility-administered medications for this visit.    OBJECTIVE: Vitals:   12/10/23 1324  BP: 121/66  Pulse: 65  Resp: 18  Temp: 97.6 F (36.4 C)     Body mass index is 21.9 kg/m.    ECOG FS:0 - Asymptomatic  General: Well-developed, well-nourished, no acute distress. Eyes: Pink conjunctiva, anicteric sclera. HEENT: Normocephalic, moist mucous membranes. Lungs: No audible wheezing or coughing. Heart: Regular rate and rhythm. Abdomen: Soft, nontender, no obvious distention. Musculoskeletal: No edema, cyanosis, or clubbing. Neuro: Alert, answering all questions appropriately. Cranial nerves grossly intact. Skin: No rashes or petechiae noted. Psych: Normal affect. Lymphatics: No cervical, calvicular, axillary or inguinal LAD.   LAB RESULTS:  No results found for: "NA", "K", "CL", "CO2", "GLUCOSE", "BUN", "CREATININE", "CALCIUM", "PROT", "ALBUMIN", "AST", "ALT", "ALKPHOS", "BILITOT", "GFRNONAA", "GFRAA"  Lab Results  Component Value Date   WBC 7.1 12/10/2023   HGB 13.6 12/10/2023   HCT 42.0 12/10/2023   MCV 90.9 12/10/2023   PLT 525 (H) 12/10/2023   Lab Results  Component Value Date   IRON 57 12/10/2023   TIBC 398 12/10/2023   IRONPCTSAT 14 12/10/2023   Lab Results  Component Value Date   FERRITIN 20 12/10/2023     STUDIES: No results found.  ASSESSMENT: Thrombocytosis.  PLAN:    Thrombocytosis: Patient's platelet count appears to be  persistently elevated and currently is 525.  She has noted to have a normal platelet count in June 2017.  Iron stores are within normal limits.  JAK2 mutation with reflex and [employer] myeloid panel are pending at time of dictation.  No intervention is needed at this time.  Patient does not require a bone marrow biopsy.  She will have a video-assisted telemedicine visit in 3 weeks to discuss her laboratory results.  I spent a total of 45 minutes reviewing chart data, face-to-face evaluation with the patient, counseling and coordination of care as detailed above.   Patient expressed understanding and was in agreement with this plan. She also understands that She can call clinic at any time with any questions, concerns, or complaints.     Jeralyn Ruths, MD   12/11/2023 8:59 AM

## 2023-12-18 LAB — JAK2 V617F RFX CALR/MPL/E12-15: JAK2 V617F %: 6.09 %

## 2023-12-24 ENCOUNTER — Other Ambulatory Visit: Payer: Self-pay | Admitting: Nephrology

## 2023-12-24 DIAGNOSIS — E7849 Other hyperlipidemia: Secondary | ICD-10-CM

## 2023-12-24 DIAGNOSIS — D75839 Thrombocytosis, unspecified: Secondary | ICD-10-CM

## 2023-12-24 DIAGNOSIS — I10 Essential (primary) hypertension: Secondary | ICD-10-CM

## 2023-12-24 DIAGNOSIS — R8290 Unspecified abnormal findings in urine: Secondary | ICD-10-CM

## 2023-12-24 DIAGNOSIS — E875 Hyperkalemia: Secondary | ICD-10-CM

## 2023-12-24 DIAGNOSIS — E1129 Type 2 diabetes mellitus with other diabetic kidney complication: Secondary | ICD-10-CM

## 2023-12-26 ENCOUNTER — Ambulatory Visit: Attending: Internal Medicine | Admitting: Internal Medicine

## 2023-12-26 ENCOUNTER — Encounter: Payer: Self-pay | Admitting: Internal Medicine

## 2023-12-26 VITALS — BP 134/62 | HR 63 | Ht 67.0 in | Wt 142.6 lb

## 2023-12-26 DIAGNOSIS — R002 Palpitations: Secondary | ICD-10-CM

## 2023-12-26 DIAGNOSIS — R001 Bradycardia, unspecified: Secondary | ICD-10-CM | POA: Diagnosis not present

## 2023-12-26 DIAGNOSIS — R55 Syncope and collapse: Secondary | ICD-10-CM | POA: Diagnosis not present

## 2023-12-26 DIAGNOSIS — I4719 Other supraventricular tachycardia: Secondary | ICD-10-CM

## 2023-12-26 NOTE — Patient Instructions (Signed)
 Medication Instructions:  Your physician recommends that you continue on your current medications as directed. Please refer to the Current Medication list given to you today.  *If you need a refill on your cardiac medications before your next appointment, please call your pharmacy*  Lab Work: None ordered.  If you have labs (blood work) drawn today and your tests are completely normal, you will receive your results only by: MyChart Message (if you have MyChart) OR A paper copy in the mail If you have any lab test that is abnormal or we need to change your treatment, we will call you to review the results.  Testing/Procedures: None ordered.   Follow-Up: At [facility], you and your health needs are our priority.  As part of our continuing mission to provide you with exceptional heart care, our providers are all part of one team.  This team includes your primary Cardiologist (physician) and Advanced Practice Providers or APPs (Physician Assistants and Nurse Practitioners) who all work together to provide you with the care you need, when you need it.  Your next appointment:   12 months

## 2023-12-26 NOTE — Progress Notes (Signed)
 Patient Care Team: Sherrie Mustache, MD as PCP - General (Internal Medicine) Jeralyn Ruths, MD as Consulting Physician (Hematology and Oncology)   HPI  Whitney Bowers is a 78 y.o. female Seen in follow-up for abrupt onset offset tachypalpitations associated with presyncope.  ZIO patch monitor demonstrated atrial tachycardia.  She was started on low-dose flecainide.Dose to 25 at the last visit but she takes it somewhat irregularly.  Flecainide infrequently but to great effect.  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 Mild atypia.   Hyperglycemia    Hyperlipidemia    Hypertension    Hypothyroidism    Osteopenia    Palpitations    Pre-syncope    Sinus bradycardia    Wears hearing aid in both ears     Past Surgical History:  Procedure Laterality Date   CATARACT EXTRACTION W/PHACO Left 09/23/2023   Procedure: CATARACT EXTRACTION PHACO AND INTRAOCULAR LENS PLACEMENT (IOC) LEFT DIABETIC 10.63 00:57.6;  Surgeon: Nevada Crane, MD;  Location: [facility];  Service: Ophthalmology;  Laterality: Left;   CATARACT EXTRACTION W/PHACO Right 10/07/2023   Procedure: CATARACT EXTRACTION PHACO AND INTRAOCULAR LENS PLACEMENT (IOC) RIGHT DIABETIC 9.37, 00:53.8;  Surgeon: Nevada Crane, MD;  Location: [facility];  Service: Ophthalmology;  Laterality: Right;   CESAREAN SECTION     1979 and 1982   COLONOSCOPY WITH PROPOFOL N/A 08/23/2016   Procedure: COLONOSCOPY WITH PROPOFOL;   Surgeon: Midge Minium, MD;  Location: [facility];  Service: Endoscopy;  Laterality: N/A;  diabetic    COLONOSCOPY WITH PROPOFOL N/A 04/24/2022   Procedure: COLONOSCOPY WITH PROPOFOL;  Surgeon: Midge Minium, MD;  Location: [facility] ENDOSCOPY;  Service: Endoscopy;  Laterality: N/A;   MANDIBLE SURGERY     POLYPECTOMY N/A 08/23/2016   Procedure: POLYPECTOMY;  Surgeon: Midge Minium, MD;  Location: [facility];  Service: Endoscopy;  Laterality: N/A;    Current Meds  Medication Sig   atorvastatin (LIPITOR) 10 MG tablet Take 10 mg by mouth daily.    flecainide (TAMBOCOR) 50 MG tablet Take 0.5 tablets (25 mg total) by mouth 2 (two) times daily. (Patient taking differently: Take 25 mg by mouth as needed.)   metFORMIN (GLUCOPHAGE) 500 MG tablet Takes 2 tablets am and 1 tablet pm daily.   metoprolol tartrate (LOPRESSOR) 25 MG tablet Take 25 mg by mouth 2 (two) times daily.    Allergies  Allergen Reactions   Penicillins     Blistered face   Tape     blisters      Review of Systems negative except from HPI and PMH  Physical Exam BP 134/62   Pulse 63   Ht 5\' 7"  (1.702 m)   Wt 142 lb 9.6 oz (64.7 kg)   SpO2 96%   BMI 22.33 kg/m  Well  developed and nourished in no acute distress HENT normal Neck supple with JVP-  flat  Clear Regular rate and rhythm, no murmurs or gallops Abd-soft with active BS No Clubbing cyanosis edema Skin-warm and dry A & Oriented  Grossly normal sensory and motor function  ECG sinus at 63 Intervals 22/09/39  Assessment and  Plan  Presyncope   Atrial tachycardia-nonsustained   Sinus bradycardia with first-degree AV block  palpitations   Blood pressure remains well-controlled on metoprolol not an ideal drug for this but with her atrial tachycardia appropriate.  Using as needed flecainide infrequently.

## 2023-12-30 LAB — INTELLIGEN MYELOID

## 2023-12-31 ENCOUNTER — Inpatient Hospital Stay: Attending: Oncology | Admitting: Oncology

## 2023-12-31 ENCOUNTER — Other Ambulatory Visit: Payer: Self-pay | Admitting: *Deleted

## 2023-12-31 ENCOUNTER — Ambulatory Visit
Admission: RE | Admit: 2023-12-31 | Discharge: 2023-12-31 | Disposition: A | Discharge status: Home / Self Care | Source: Ambulatory Visit | Attending: Nephrology | Admitting: Nephrology

## 2023-12-31 DIAGNOSIS — E7849 Other hyperlipidemia: Secondary | ICD-10-CM | POA: Insufficient documentation

## 2023-12-31 DIAGNOSIS — N179 Acute kidney failure, unspecified: Secondary | ICD-10-CM | POA: Insufficient documentation

## 2023-12-31 DIAGNOSIS — D75839 Thrombocytosis, unspecified: Secondary | ICD-10-CM | POA: Diagnosis not present

## 2023-12-31 DIAGNOSIS — E1129 Type 2 diabetes mellitus with other diabetic kidney complication: Secondary | ICD-10-CM | POA: Diagnosis present

## 2023-12-31 DIAGNOSIS — I10 Essential (primary) hypertension: Secondary | ICD-10-CM | POA: Insufficient documentation

## 2023-12-31 DIAGNOSIS — E875 Hyperkalemia: Secondary | ICD-10-CM | POA: Diagnosis not present

## 2023-12-31 DIAGNOSIS — R8290 Unspecified abnormal findings in urine: Secondary | ICD-10-CM | POA: Insufficient documentation

## 2023-12-31 DIAGNOSIS — D473 Essential (hemorrhagic) thrombocythemia: Secondary | ICD-10-CM | POA: Diagnosis not present

## 2023-12-31 MED ORDER — HYDROXYUREA 500 MG PO CAPS: 500.0000 mg | ORAL_CAPSULE | Freq: Every day | ORAL | 3 refills | Status: DC

## 2023-12-31 NOTE — Progress Notes (Signed)
 [facility]  Telephone:(336) [phone] Fax:(336) [phone]  ID: Whitney Bowers OB: 1946-08-11  MR#: [id number]  QIO#:[id number]  Patient Care Team: Sherrie Mustache, MD as PCP - General (Internal Medicine) Jeralyn Ruths, MD as Consulting Physician (Hematology and Oncology)  I connected with Whitney Bowers on 12/31/23 at  2:45 PM EDT by video enabled telemedicine visit and verified that I am speaking with the correct person using two identifiers.   I discussed the limitations, risks, security and privacy concerns of performing an evaluation and management service by telemedicine and the availability of in-person appointments. I also discussed with the patient that there may be a patient responsible charge related to this service. The patient expressed understanding and agreed to proceed.   Other persons participating in the visit and their role in the encounter: Patient, MD.  Patient's location: Home. Provider's location: Clinic.   CHIEF COMPLAINT: JAK2 positive essential thrombocytosis.  INTERVAL HISTORY: Patient agreed to video-assisted telemedicine visit for further evaluation and discussion of her laboratory results.  She continues to feel well and remains asymptomatic.  She has no neurologic complaints.  She denies any recent fevers or illnesses.  She has a good appetite and denies weight loss.  She has no chest pain, shortness of breath, cough, or hemoptysis.  She denies any nausea, vomiting, constipation, or diarrhea.  She has no urinary complaints.  Patient offers no specific complaints today.  REVIEW OF SYSTEMS:   Review of Systems  Constitutional: Negative.  Negative for fever, malaise/fatigue and weight loss.  Respiratory: Negative.  Negative for cough, hemoptysis and shortness of breath.   Cardiovascular: Negative.  Negative for chest pain and leg swelling.  Gastrointestinal:  Negative for abdominal pain.  Genitourinary: Negative.  Negative for dysuria.   Musculoskeletal: Negative.  Negative for back pain.  Skin: Negative.  Negative for rash.  Neurological: Negative.  Negative for dizziness, focal weakness, weakness and headaches.  Psychiatric/Behavioral: Negative.  The patient is not nervous/anxious.     As per HPI. Otherwise, a complete review of systems is negative.  PAST MEDICAL HISTORY: Past Medical History:  Diagnosis Date   Arthritis    Atrial tachycardia (HCC)    Diabetes mellitus without complication (HCC)    Dysplastic nevus 09/20/2009   Left mid volar forearm. Mild atypia.   Hyperglycemia    Hyperlipidemia    Hypertension    Hypothyroidism    Osteopenia    Palpitations    Pre-syncope    Sinus bradycardia    Wears hearing aid in both ears     PAST SURGICAL HISTORY: Past Surgical History:  Procedure Laterality Date   CATARACT EXTRACTION W/PHACO Left 09/23/2023   Procedure: CATARACT EXTRACTION PHACO AND INTRAOCULAR LENS PLACEMENT (IOC) LEFT DIABETIC 10.63 00:57.6;  Surgeon: Nevada Crane, MD;  Location: [facility];  Service: Ophthalmology;  Laterality: Left;   CATARACT EXTRACTION W/PHACO Right 10/07/2023   Procedure: CATARACT EXTRACTION PHACO AND INTRAOCULAR LENS PLACEMENT (IOC) RIGHT DIABETIC 9.37, 00:53.8;  Surgeon: Nevada Crane, MD;  Location: [facility];  Service: Ophthalmology;  Laterality: Right;   CESAREAN SECTION     1979 and 1982   COLONOSCOPY WITH PROPOFOL N/A 08/23/2016   Procedure: COLONOSCOPY WITH PROPOFOL;  Surgeon: Midge Minium, MD;  Location: [facility];  Service: Endoscopy;  Laterality: N/A;  diabetic    COLONOSCOPY WITH PROPOFOL N/A 04/24/2022   Procedure: COLONOSCOPY WITH PROPOFOL;  Surgeon: Midge Minium, MD;  Location: [facility] END[facility];  Service: Endoscopy;  Laterality: N/A;  MANDIBLE SURGERY     POLYPECTOMY N/A 08/23/2016   Procedure: POLYPECTOMY;  Surgeon: Marnee Sink, MD;  Location: [facility];  Service: Endoscopy;  Laterality: N/A;    FAMILY  HISTORY: Family History  Problem Relation Age of Onset   Atrial fibrillation Mother    Hyperlipidemia Mother    Hypertension Mother    Hypertension Father    Stroke Father    Breast cancer Maternal Aunt 37   Breast cancer Niece     ADVANCED DIRECTIVES (Y/N):  N  HEALTH MAINTENANCE: Social History   Tobacco Use   Smoking status: Never   Smokeless tobacco: Never  Vaping Use   Vaping status: Never Used  Substance Use Topics   Alcohol use: Yes    Alcohol/week: 7.0 standard drinks of alcohol    Types: 7 Glasses of wine per week    Comment: wine   Drug use: No     Colonoscopy:  PAP:  Bone density:  Lipid panel:  Allergies  Allergen Reactions   Penicillins     Blistered face   Tape     blisters    Current Outpatient Medications  Medication Sig Dispense Refill   hydroxyurea (HYDREA) 500 MG capsule Take 1 capsule (500 mg total) by mouth daily. May take with food to minimize GI side effects. 90 capsule 3   atorvastatin (LIPITOR) 10 MG tablet Take 10 mg by mouth daily.      flecainide (TAMBOCOR) 50 MG tablet Take 0.5 tablets (25 mg total) by mouth 2 (two) times daily. (Patient taking differently: Take 25 mg by mouth as needed.) 180 tablet 1   metFORMIN (GLUCOPHAGE) 500 MG tablet Takes 2 tablets am and 1 tablet pm daily.     metoprolol tartrate (LOPRESSOR) 25 MG tablet Take 25 mg by mouth 2 (two) times daily.     No current facility-administered medications for this visit.    OBJECTIVE: There were no vitals filed for this visit.    There is no height or weight on file to calculate BMI.    ECOG FS:0 - Asymptomatic  General: Well-developed, well-nourished, no acute distress. HEENT: Normocephalic. Neuro: Alert, answering all questions appropriately. Cranial nerves grossly intact. Psych: Normal affect.  LAB RESULTS:  No results found for: "NA", "K", "CL", "CO2", "GLUCOSE", "BUN", "CREATININE", "CALCIUM", "PROT", "ALBUMIN", "AST", "ALT", "ALKPHOS", "BILITOT",  "GFRNONAA", "GFRAA"  Lab Results  Component Value Date   WBC 7.1 12/10/2023   HGB 13.6 12/10/2023   HCT 42.0 12/10/2023   MCV 90.9 12/10/2023   PLT 525 (H) 12/10/2023   Lab Results  Component Value Date   IRON 57 12/10/2023   TIBC 398 12/10/2023   IRONPCTSAT 14 12/10/2023   Lab Results  Component Value Date   FERRITIN 20 12/10/2023     STUDIES: No results found.  ASSESSMENT: JAK2 positive essential thrombocytosis.  PLAN:    JAK2 positive essential thrombocytosis: Patient's recent platelet count is 525.  All of her laboratory work is either negative or within normal limits.  IntelliGEN myeloid also picked up the JAK2 mutation.  Patient was initiated on 500 mg Hydrea daily.  No further intervention is needed.  Patient does not require bone marrow biopsy.  Return to clinic in 1 month with repeat laboratory work and to assess her toleration of Hydrea.    I provided 20 minutes of face-to-face video visit time during this encounter which included chart review, counseling, and coordination of care as documented above.   Patient expressed understanding and was in  agreement with this plan. She also understands that She can call clinic at any time with any questions, concerns, or complaints.     Shellie Dials, MD   12/31/2023 2:45 PM

## 2024-01-28 ENCOUNTER — Encounter: Payer: Self-pay | Admitting: Oncology

## 2024-01-28 ENCOUNTER — Inpatient Hospital Stay: Attending: Oncology

## 2024-01-28 ENCOUNTER — Inpatient Hospital Stay (HOSPITAL_BASED_OUTPATIENT_CLINIC_OR_DEPARTMENT_OTHER): Admitting: Oncology

## 2024-01-28 VITALS — BP 131/78 | HR 60 | Temp 97.0°F | Resp 17 | Wt 145.0 lb

## 2024-01-28 DIAGNOSIS — D75839 Thrombocytosis, unspecified: Secondary | ICD-10-CM

## 2024-01-28 DIAGNOSIS — D473 Essential (hemorrhagic) thrombocythemia: Secondary | ICD-10-CM | POA: Diagnosis not present

## 2024-01-28 DIAGNOSIS — M8580 Other specified disorders of bone density and structure, unspecified site: Secondary | ICD-10-CM | POA: Insufficient documentation

## 2024-01-28 LAB — CBC WITH DIFFERENTIAL (CANCER CENTER ONLY)
Abs Immature Granulocytes: 0.02 10*3/uL (ref 0.00–0.07)
Basophils Absolute: 0.1 10*3/uL (ref 0.0–0.1)
Basophils Relative: 1 %
Eosinophils Absolute: 0.2 10*3/uL (ref 0.0–0.5)
Eosinophils Relative: 4 %
HCT: 41.7 % (ref 36.0–46.0)
Hemoglobin: 13.8 g/dL (ref 12.0–15.0)
Immature Granulocytes: 0 %
Lymphocytes Relative: 18 %
Lymphs Abs: 0.9 10*3/uL (ref 0.7–4.0)
MCH: 30.4 pg (ref 26.0–34.0)
MCHC: 33.1 g/dL (ref 30.0–36.0)
MCV: 91.9 fL (ref 80.0–100.0)
Monocytes Absolute: 0.5 10*3/uL (ref 0.1–1.0)
Monocytes Relative: 10 %
Neutro Abs: 3.2 10*3/uL (ref 1.7–7.7)
Neutrophils Relative %: 67 %
Platelet Count: 374 10*3/uL (ref 150–400)
RBC: 4.54 MIL/uL (ref 3.87–5.11)
RDW: 16.9 % — ABNORMAL HIGH (ref 11.5–15.5)
WBC Count: 4.9 10*3/uL (ref 4.0–10.5)
nRBC: 0 % (ref 0.0–0.2)

## 2024-01-28 NOTE — Progress Notes (Signed)
 Patient here for oncology follow-up appointment, expresses no complaints or concerns at this time.

## 2024-01-28 NOTE — Progress Notes (Signed)
 [facility]  Telephone:(336) [phone] Fax:(336) [phone]  ID: Whitney Bowers OB: 31-Mar-1946  MR#: [id number]  WNU#:[id number]  Patient Care Team: Annelle Kiel, MD as PCP - General (Internal Medicine) Shellie Dials, MD as Consulting Physician (Hematology and Oncology)  CHIEF COMPLAINT: JAK2 positive essential thrombocytosis.  INTERVAL HISTORY: Patient returns to clinic today for repeat laboratory work, further evaluation, to assess her toleration of Hydrea .  She is tolerating treatment well without significant side effects.  She currently feels well and is asymptomatic.  She has no neurologic complaints.  She denies any recent fevers or illnesses.  She has a good appetite and denies weight loss.  She has no chest pain, shortness of breath, cough, or hemoptysis.  She denies any nausea, vomiting, constipation, or diarrhea.  She has no urinary complaints.  Patient offers no specific complaints today.  REVIEW OF SYSTEMS:   Review of Systems  Constitutional: Negative.  Negative for fever, malaise/fatigue and weight loss.  Respiratory: Negative.  Negative for cough, hemoptysis and shortness of breath.   Cardiovascular: Negative.  Negative for chest pain and leg swelling.  Gastrointestinal:  Negative for abdominal pain.  Genitourinary: Negative.  Negative for dysuria.  Musculoskeletal: Negative.  Negative for back pain.  Skin: Negative.  Negative for rash.  Neurological: Negative.  Negative for dizziness, focal weakness, weakness and headaches.  Psychiatric/Behavioral: Negative.  The patient is not nervous/anxious.     As per HPI. Otherwise, a complete review of systems is negative.  PAST MEDICAL HISTORY: Past Medical History:  Diagnosis Date   Arthritis    Atrial tachycardia (HCC)    Diabetes mellitus without complication (HCC)    Dysplastic nevus 09/20/2009   Left mid volar forearm. Mild atypia.   Hyperglycemia    Hyperlipidemia    Hypertension     Hypothyroidism    Osteopenia    Palpitations    Pre-syncope    Sinus bradycardia    Wears hearing aid in both ears     PAST SURGICAL HISTORY: Past Surgical History:  Procedure Laterality Date   CATARACT EXTRACTION W/PHACO Left 09/23/2023   Procedure: CATARACT EXTRACTION PHACO AND INTRAOCULAR LENS PLACEMENT (IOC) LEFT DIABETIC 10.63 00:57.6;  Surgeon: Rosa College, MD;  Location: [facility];  Service: Ophthalmology;  Laterality: Left;   CATARACT EXTRACTION W/PHACO Right 10/07/2023   Procedure: CATARACT EXTRACTION PHACO AND INTRAOCULAR LENS PLACEMENT (IOC) RIGHT DIABETIC 9.37, 00:53.8;  Surgeon: Rosa College, MD;  Location: [facility];  Service: Ophthalmology;  Laterality: Right;   CESAREAN SECTION     1979 and 1982   COLONOSCOPY WITH PROPOFOL  N/A 08/23/2016   Procedure: COLONOSCOPY WITH PROPOFOL ;  Surgeon: Marnee Sink, MD;  Location: [facility];  Service: Endoscopy;  Laterality: N/A;  diabetic    COLONOSCOPY WITH PROPOFOL  N/A 04/24/2022   Procedure: COLONOSCOPY WITH PROPOFOL ;  Surgeon: Marnee Sink, MD;  Location: [facility] ENDOSCOPY;  Service: Endoscopy;  Laterality: N/A;   MANDIBLE SURGERY     POLYPECTOMY N/A 08/23/2016   Procedure: POLYPECTOMY;  Surgeon: Marnee Sink, MD;  Location: [facility];  Service: Endoscopy;  Laterality: N/A;    FAMILY HISTORY: Family History  Problem Relation Age of Onset   Atrial fibrillation Mother    Hyperlipidemia Mother    Hypertension Mother    Hypertension Father    Stroke Father    Breast cancer Maternal Aunt 5   Breast cancer Niece     ADVANCED DIRECTIVES (Y/N):  N  HEALTH MAINTENANCE: Social History  Tobacco Use   Smoking status: Never   Smokeless tobacco: Never  Vaping Use   Vaping status: Never Used  Substance Use Topics   Alcohol use: Yes    Alcohol/week: 7.0 standard drinks of alcohol    Types: 7 Glasses of wine per week    Comment: wine   Drug use: No      Colonoscopy:  PAP:  Bone density:  Lipid panel:  Allergies  Allergen Reactions   Penicillins     Blistered face   Tape     blisters    Current Outpatient Medications  Medication Sig Dispense Refill   atorvastatin (LIPITOR) 10 MG tablet Take 10 mg by mouth daily.      flecainide  (TAMBOCOR ) 50 MG tablet Take 0.5 tablets (25 mg total) by mouth 2 (two) times daily. (Patient taking differently: Take 25 mg by mouth as needed.) 180 tablet 1   hydroxyurea  (HYDREA ) 500 MG capsule Take 1 capsule (500 mg total) by mouth daily. May take with food to minimize GI side effects. 90 capsule 3   metFORMIN (GLUCOPHAGE) 500 MG tablet Takes 2 tablets am and 1 tablet pm daily.     metoprolol tartrate (LOPRESSOR) 25 MG tablet Take 25 mg by mouth 2 (two) times daily.     No current facility-administered medications for this visit.    OBJECTIVE: Vitals:   01/28/24 1024 01/28/24 1026  BP: (!) 142/83 131/78  Pulse: 60   Resp: 17   Temp: (!) 97 F (36.1 C)   SpO2: 100%       Body mass index is 22.71 kg/m.    ECOG FS:0 - Asymptomatic  General: Well-developed, well-nourished, no acute distress. Eyes: Pink conjunctiva, anicteric sclera. HEENT: Normocephalic, moist mucous membranes. Lungs: No audible wheezing or coughing. Heart: Regular rate and rhythm. Abdomen: Soft, nontender, no obvious distention. Musculoskeletal: No edema, cyanosis, or clubbing. Neuro: Alert, answering all questions appropriately. Cranial nerves grossly intact. Skin: No rashes or petechiae noted. Psych: Normal affect.  LAB RESULTS:  No results found for: "NA", "K", "CL", "CO2", "GLUCOSE", "BUN", "CREATININE", "CALCIUM", "PROT", "ALBUMIN", "AST", "ALT", "ALKPHOS", "BILITOT", "GFRNONAA", "GFRAA"  Lab Results  Component Value Date   WBC 4.9 01/28/2024   NEUTROABS 3.2 01/28/2024   HGB 13.8 01/28/2024   HCT 41.7 01/28/2024   MCV 91.9 01/28/2024   PLT 374 01/28/2024   Lab Results  Component Value Date   IRON 57  12/10/2023   TIBC 398 12/10/2023   IRONPCTSAT 14 12/10/2023   Lab Results  Component Value Date   FERRITIN 20 12/10/2023     STUDIES: US  RENAL Result Date: 01/11/2024 CLINICAL DATA:  abnormal urine; benign hypertension; D with kidney manifestations; hyperkalemia; hypercalcemia; hyperlipidemia; thrombocytosis; acute renal failure EXAM: RENAL / URINARY TRACT ULTRASOUND COMPLETE COMPARISON:  None Available. FINDINGS: Right Kidney: Renal measurements: 11.6 x 3.8 x 5.0 cm = volume: 116 mL. Echogenicity within normal limits. No mass or hydronephrosis visualized. Left Kidney: Renal measurements: 10.4 x 5.3 x 3.7 cm = volume: 106 mL. Echogenicity within normal limits. No hydronephrosis visualized. Incidental 7 mm cyst (for which no dedicated imaging follow-up is recommended). Bladder: Appears normal for degree of bladder distention. Other: Increased hepatic echogenicity, mild. IMPRESSION: 1. No hydronephrosis. 2. Query hepatic steatosis. Electronically Signed   By: Clancy Crimes M.D.   On: 01/11/2024 11:09    ASSESSMENT: JAK2 positive essential thrombocytosis.  PLAN:    JAK2 positive essential thrombocytosis: Patient initiated 500 mg Hydrea  daily in April 2025.  Her platelet count  is now within normal limits.  Previously, all of her laboratory work is either negative or within normal limits.  IntelliGEN myeloid also picked up the JAK2 mutation.  No further intervention is needed.  Patient does not require bone marrow biopsy.  Return to clinic in 1 month with laboratory work only and then in 2 months with laboratory work and further evaluation.  I spent a total of 20 minutes reviewing chart data, face-to-face evaluation with the patient, counseling and coordination of care as detailed above.   Patient expressed understanding and was in agreement with this plan. She also understands that She can call clinic at any time with any questions, concerns, or complaints.     Shellie Dials, MD    01/28/2024 10:37 AM

## 2024-01-29 ENCOUNTER — Other Ambulatory Visit

## 2024-01-29 ENCOUNTER — Ambulatory Visit: Admitting: Oncology

## 2024-02-28 ENCOUNTER — Other Ambulatory Visit

## 2024-03-09 ENCOUNTER — Inpatient Hospital Stay: Attending: Oncology

## 2024-03-09 DIAGNOSIS — D473 Essential (hemorrhagic) thrombocythemia: Secondary | ICD-10-CM | POA: Diagnosis present

## 2024-03-09 LAB — CBC WITH DIFFERENTIAL (CANCER CENTER ONLY)
Abs Immature Granulocytes: 0.02 10*3/uL (ref 0.00–0.07)
Basophils Absolute: 0 10*3/uL (ref 0.0–0.1)
Basophils Relative: 1 %
Eosinophils Absolute: 0.1 10*3/uL (ref 0.0–0.5)
Eosinophils Relative: 3 %
HCT: 37.2 % (ref 36.0–46.0)
Hemoglobin: 12.5 g/dL (ref 12.0–15.0)
Immature Granulocytes: 0 %
Lymphocytes Relative: 20 %
Lymphs Abs: 1 10*3/uL (ref 0.7–4.0)
MCH: 32.2 pg (ref 26.0–34.0)
MCHC: 33.6 g/dL (ref 30.0–36.0)
MCV: 95.9 fL (ref 80.0–100.0)
Monocytes Absolute: 0.4 10*3/uL (ref 0.1–1.0)
Monocytes Relative: 7 %
Neutro Abs: 3.7 10*3/uL (ref 1.7–7.7)
Neutrophils Relative %: 69 %
Platelet Count: 304 10*3/uL (ref 150–400)
RBC: 3.88 MIL/uL (ref 3.87–5.11)
RDW: 18.9 % — ABNORMAL HIGH (ref 11.5–15.5)
WBC Count: 5.3 10*3/uL (ref 4.0–10.5)
nRBC: 0 % (ref 0.0–0.2)

## 2024-03-31 ENCOUNTER — Inpatient Hospital Stay: Admitting: Oncology

## 2024-03-31 ENCOUNTER — Encounter: Payer: Self-pay | Admitting: Oncology

## 2024-03-31 ENCOUNTER — Inpatient Hospital Stay: Attending: Oncology

## 2024-03-31 VITALS — BP 129/75 | HR 67 | Temp 97.3°F | Resp 16 | Wt 144.0 lb

## 2024-03-31 DIAGNOSIS — Z79899 Other long term (current) drug therapy: Secondary | ICD-10-CM | POA: Insufficient documentation

## 2024-03-31 DIAGNOSIS — D473 Essential (hemorrhagic) thrombocythemia: Secondary | ICD-10-CM

## 2024-03-31 LAB — CBC WITH DIFFERENTIAL (CANCER CENTER ONLY)
Abs Immature Granulocytes: 0.02 K/uL (ref 0.00–0.07)
Basophils Absolute: 0.1 K/uL (ref 0.0–0.1)
Basophils Relative: 1 %
Eosinophils Absolute: 0.2 K/uL (ref 0.0–0.5)
Eosinophils Relative: 3 %
HCT: 41.1 % (ref 36.0–46.0)
Hemoglobin: 13.5 g/dL (ref 12.0–15.0)
Immature Granulocytes: 0 %
Lymphocytes Relative: 22 %
Lymphs Abs: 1.4 K/uL (ref 0.7–4.0)
MCH: 32.5 pg (ref 26.0–34.0)
MCHC: 32.8 g/dL (ref 30.0–36.0)
MCV: 98.8 fL (ref 80.0–100.0)
Monocytes Absolute: 0.5 K/uL (ref 0.1–1.0)
Monocytes Relative: 7 %
Neutro Abs: 4.2 K/uL (ref 1.7–7.7)
Neutrophils Relative %: 67 %
Platelet Count: 316 K/uL (ref 150–400)
RBC: 4.16 MIL/uL (ref 3.87–5.11)
RDW: 18.8 % — ABNORMAL HIGH (ref 11.5–15.5)
WBC Count: 6.3 K/uL (ref 4.0–10.5)
nRBC: 0 % (ref 0.0–0.2)

## 2024-03-31 MED ORDER — HYDROXYUREA 500 MG PO CAPS: 500.0000 mg | ORAL_CAPSULE | Freq: Every day | ORAL | 3 refills | Status: AC

## 2024-03-31 NOTE — Progress Notes (Signed)
 Pt in for follow up and denies any concerns.

## 2024-03-31 NOTE — Progress Notes (Signed)
 [facility]  Telephone:(336) [phone] Fax:(336) [phone]  ID: Whitney Bowers Bring OB: February 18, 1946  MR#: [id number]  RDW#:[id number]  Patient Care Team: Weyman Bright, MD as PCP - General (Internal Medicine) Jacobo Evalene PARAS, MD as Consulting Physician (Hematology and Oncology)  CHIEF COMPLAINT: JAK2 positive essential thrombocytosis.  INTERVAL HISTORY: Patient returns to clinic today for repeat laboratory and further evaluation.  She currently feels well and is asymptomatic.  She continues to tolerate Hydrea  well without significant side effects.  She has no neurologic complaints.  She denies any recent fevers or illnesses.  She has a good appetite and denies weight loss.  She has no chest pain, shortness of breath, cough, or hemoptysis.  She denies any nausea, vomiting, constipation, or diarrhea.  She has no urinary complaints.  Patient offers no specific complaints today.  REVIEW OF SYSTEMS:   Review of Systems  Constitutional: Negative.  Negative for fever, malaise/fatigue and weight loss.  Respiratory: Negative.  Negative for cough, hemoptysis and shortness of breath.   Cardiovascular: Negative.  Negative for chest pain and leg swelling.  Gastrointestinal:  Negative for abdominal pain.  Genitourinary: Negative.  Negative for dysuria.  Musculoskeletal: Negative.  Negative for back pain.  Skin: Negative.  Negative for rash.  Neurological: Negative.  Negative for dizziness, focal weakness, weakness and headaches.  Psychiatric/Behavioral: Negative.  The patient is not nervous/anxious.     As per HPI. Otherwise, a complete review of systems is negative.  PAST MEDICAL HISTORY: Past Medical History:  Diagnosis Date   Arthritis    Atrial tachycardia (HCC)    Diabetes mellitus without complication (HCC)    Dysplastic nevus 09/20/2009   Left mid volar forearm. Mild atypia.   Hyperglycemia    Hyperlipidemia    Hypertension    Hypothyroidism    Osteopenia     Palpitations    Pre-syncope    Sinus bradycardia    Wears hearing aid in both ears     PAST SURGICAL HISTORY: Past Surgical History:  Procedure Laterality Date   CATARACT EXTRACTION W/PHACO Left 09/23/2023   Procedure: CATARACT EXTRACTION PHACO AND INTRAOCULAR LENS PLACEMENT (IOC) LEFT DIABETIC 10.63 00:57.6;  Surgeon: Myrna Adine Anes, MD;  Location: [facility];  Service: Ophthalmology;  Laterality: Left;   CATARACT EXTRACTION W/PHACO Right 10/07/2023   Procedure: CATARACT EXTRACTION PHACO AND INTRAOCULAR LENS PLACEMENT (IOC) RIGHT DIABETIC 9.37, 00:53.8;  Surgeon: Myrna Adine Anes, MD;  Location: [facility];  Service: Ophthalmology;  Laterality: Right;   CESAREAN SECTION     1979 and 1982   COLONOSCOPY WITH PROPOFOL  N/A 08/23/2016   Procedure: COLONOSCOPY WITH PROPOFOL ;  Surgeon: Rogelia Copping, MD;  Location: [facility];  Service: Endoscopy;  Laterality: N/A;  diabetic    COLONOSCOPY WITH PROPOFOL  N/A 04/24/2022   Procedure: COLONOSCOPY WITH PROPOFOL ;  Surgeon: Copping Rogelia, MD;  Location: [facility] ENDOSCOPY;  Service: Endoscopy;  Laterality: N/A;   MANDIBLE SURGERY     POLYPECTOMY N/A 08/23/2016   Procedure: POLYPECTOMY;  Surgeon: Rogelia Copping, MD;  Location: [facility];  Service: Endoscopy;  Laterality: N/A;    FAMILY HISTORY: Family History  Problem Relation Age of Onset   Atrial fibrillation Mother    Hyperlipidemia Mother    Hypertension Mother    Hypertension Father    Stroke Father    Breast cancer Maternal Aunt 52   Breast cancer Niece     ADVANCED DIRECTIVES (Y/N):  N  HEALTH MAINTENANCE: Social History   Tobacco Use   Smoking  status: Never   Smokeless tobacco: Never  Vaping Use   Vaping status: Never Used  Substance Use Topics   Alcohol use: Yes    Alcohol/week: 7.0 standard drinks of alcohol    Types: 7 Glasses of wine per week    Comment: wine   Drug use: No     Colonoscopy:  PAP:  Bone density:  Lipid  panel:  Allergies  Allergen Reactions   Penicillins     Blistered face   Tape     blisters    Current Outpatient Medications  Medication Sig Dispense Refill   atorvastatin (LIPITOR) 10 MG tablet Take 10 mg by mouth daily.      flecainide  (TAMBOCOR ) 50 MG tablet Take 0.5 tablets (25 mg total) by mouth 2 (two) times daily. 180 tablet 1   hydroxyurea  (HYDREA ) 500 MG capsule Take 1 capsule (500 mg total) by mouth daily. May take with food to minimize GI side effects. 90 capsule 3   metFORMIN (GLUCOPHAGE) 500 MG tablet Takes 2 tablets am and 1 tablet pm daily.     metoprolol tartrate (LOPRESSOR) 25 MG tablet Take 25 mg by mouth 2 (two) times daily.     No current facility-administered medications for this visit.    OBJECTIVE: Vitals:   03/31/24 1420  BP: 129/75  Pulse: 67  Resp: 16  Temp: (!) 97.3 F (36.3 C)  SpO2: 100%      Body mass index is 22.55 kg/m.    ECOG FS:0 - Asymptomatic  General: Well-developed, well-nourished, no acute distress. Eyes: Pink conjunctiva, anicteric sclera. HEENT: Normocephalic, moist mucous membranes. Lungs: No audible wheezing or coughing. Heart: Regular rate and rhythm. Abdomen: Soft, nontender, no obvious distention. Musculoskeletal: No edema, cyanosis, or clubbing. Neuro: Alert, answering all questions appropriately. Cranial nerves grossly intact. Skin: No rashes or petechiae noted. Psych: Normal affect.  LAB RESULTS:  No results found for: NA, K, CL, CO2, GLUCOSE, BUN, CREATININE, CALCIUM, PROT, ALBUMIN, AST, ALT, ALKPHOS, BILITOT, GFRNONAA, GFRAA  Lab Results  Component Value Date   WBC 6.3 03/31/2024   NEUTROABS 4.2 03/31/2024   HGB 13.5 03/31/2024   HCT 41.1 03/31/2024   MCV 98.8 03/31/2024   PLT 316 03/31/2024   Lab Results  Component Value Date   IRON 57 12/10/2023   TIBC 398 12/10/2023   IRONPCTSAT 14 12/10/2023   Lab Results  Component Value Date   FERRITIN 20 12/10/2023      STUDIES: No results found.   ASSESSMENT: JAK2 positive essential thrombocytosis.  PLAN:    JAK2 positive essential thrombocytosis: Patient initiated 500 mg Hydrea  daily in April 2025.  Platelet count continues to be within normal limits.  Previously, all of her laboratory work is either negative or within normal limits.  IntelliGEN myeloid also picked up the JAK2 mutation.  No further intervention is needed.  Patient does not require bone marrow biopsy.  Continue Hydrea  as prescribed.  Return to clinic in 3 months with repeat laboratory work and video-assisted telemedicine visit.  I spent a total of 20 minutes reviewing chart data, face-to-face evaluation with the patient, counseling and coordination of care as detailed above.   Patient expressed understanding and was in agreement with this plan. She also understands that She can call clinic at any time with any questions, concerns, or complaints.     Evalene JINNY Reusing, MD   03/31/2024 2:46 PM

## 2024-05-14 ENCOUNTER — Encounter: Payer: Self-pay | Admitting: Internal Medicine

## 2024-05-14 ENCOUNTER — Ambulatory Visit: Payer: Self-pay | Admitting: Internal Medicine

## 2024-05-14 VITALS — BP 118/72 | HR 62 | Ht 67.0 in | Wt 144.0 lb

## 2024-05-14 DIAGNOSIS — R000 Tachycardia, unspecified: Secondary | ICD-10-CM | POA: Insufficient documentation

## 2024-05-14 DIAGNOSIS — Z1231 Encounter for screening mammogram for malignant neoplasm of breast: Secondary | ICD-10-CM | POA: Insufficient documentation

## 2024-05-14 DIAGNOSIS — E1159 Type 2 diabetes mellitus with other circulatory complications: Secondary | ICD-10-CM

## 2024-05-14 DIAGNOSIS — E119 Type 2 diabetes mellitus without complications: Secondary | ICD-10-CM | POA: Insufficient documentation

## 2024-05-14 DIAGNOSIS — E782 Mixed hyperlipidemia: Secondary | ICD-10-CM

## 2024-05-14 DIAGNOSIS — E1169 Type 2 diabetes mellitus with other specified complication: Secondary | ICD-10-CM | POA: Insufficient documentation

## 2024-05-14 DIAGNOSIS — Z13820 Encounter for screening for osteoporosis: Secondary | ICD-10-CM | POA: Insufficient documentation

## 2024-05-14 DIAGNOSIS — I152 Hypertension secondary to endocrine disorders: Secondary | ICD-10-CM | POA: Insufficient documentation

## 2024-05-14 LAB — POCT CBG (FASTING - GLUCOSE)-MANUAL ENTRY: Glucose Fasting, POC: 93 mg/dL (ref 70–99)

## 2024-05-14 NOTE — Progress Notes (Signed)
 New Patient Office Visit  Subjective   Patient ID: Whitney Bowers, female    DOB: 08-Jan-1946  Age: 78 y.o. MRN: [id number]  CC:  Chief Complaint  Patient presents with   Establish Care    NPE    HPI Whitney Bowers presents to establish care Previous Primary Care provider/office:   she does not have additional concerns to discuss today.   Patient is seen today as a new patient with our office. She sees Cardiology, nephrology, Oncology, Ophthalmology, and GI specialists. She is prescribed flecainide  as needed for tachycardia. She reports taking 1 pill last week but prior to that it was in June the last time she needed it. She has never been able to determine a cause of her tachycardia. She endorses working out twice a week with a trainer and eats a healthy diet. Her last colonoscopy was in 2023 and suggested another one in 5 years. She is due for her mammogram and DEXA scan. Will order routine labs that are due. Patient is not fasting today, but encouraged to return tomorrow for fasting blood work. Patient had questions about recommended vaccines she should get. She reports she got the old zostervax vaccine and occasionally gets a flu shot. She is unsure when she received her last Td vaccine. Based off her age and recommendations she should get a Tdap, Shingrix, prevnar 20, and seasonal flu/covid shot. Patient advised that most vaccines can be given at the pharmacy; we will have the flu shots available in the office if she would like to get one this season.    Outpatient Encounter Medications as of 05/14/2024  Medication Sig   atorvastatin (LIPITOR) 10 MG tablet Take 10 mg by mouth daily.    flecainide  (TAMBOCOR ) 50 MG tablet Take 0.5 tablets (25 mg total) by mouth 2 (two) times daily. (Patient taking differently: Take 25 mg by mouth as needed.)   hydroxyurea  (HYDREA ) 500 MG capsule Take 1 capsule (500 mg total) by mouth daily. May take with food to minimize GI side effects.   metFORMIN  (GLUCOPHAGE) 500 MG tablet Takes 2 tablets am and 1 tablet pm daily.   metoprolol tartrate (LOPRESSOR) 25 MG tablet Take 25 mg by mouth 2 (two) times daily.   No facility-administered encounter medications on file as of 05/14/2024.    Past Medical History:  Diagnosis Date   Arthritis    Atrial tachycardia (HCC)    Diabetes mellitus without complication (HCC)    Dysplastic nevus 09/20/2009   Left mid volar forearm. Mild atypia.   Hyperglycemia    Hyperlipidemia    Hypertension    Hypothyroidism    Osteopenia    Palpitations    Pre-syncope    Sinus bradycardia    Wears hearing aid in both ears     Past Surgical History:  Procedure Laterality Date   CATARACT EXTRACTION W/PHACO Left 09/23/2023   Procedure: CATARACT EXTRACTION PHACO AND INTRAOCULAR LENS PLACEMENT (IOC) LEFT DIABETIC 10.63 00:57.6;  Surgeon: Myrna Adine Anes, MD;  Location: [facility];  Service: Ophthalmology;  Laterality: Left;   CATARACT EXTRACTION W/PHACO Right 10/07/2023   Procedure: CATARACT EXTRACTION PHACO AND INTRAOCULAR LENS PLACEMENT (IOC) RIGHT DIABETIC 9.37, 00:53.8;  Surgeon: Myrna Adine Anes, MD;  Location: [facility];  Service: Ophthalmology;  Laterality: Right;   CESAREAN SECTION     1979 and 1982   COLONOSCOPY WITH PROPOFOL  N/A 08/23/2016   Procedure: COLONOSCOPY WITH PROPOFOL ;  Surgeon: Rogelia Copping, MD;  Location: [facility];  Service: Endoscopy;  Laterality: N/A;  diabetic    COLONOSCOPY WITH PROPOFOL  N/A 04/24/2022   Procedure: COLONOSCOPY WITH PROPOFOL ;  Surgeon: Jinny Carmine, MD;  Location: [facility];  Service: Endoscopy;  Laterality: N/A;   MANDIBLE SURGERY     POLYPECTOMY N/A 08/23/2016   Procedure: POLYPECTOMY;  Surgeon: Carmine Jinny, MD;  Location: [facility];  Service: Endoscopy;  Laterality: N/A;    Family History  Problem Relation Age of Onset   Atrial fibrillation Mother    Hyperlipidemia Mother    Hypertension Mother    Hypertension  Father    Stroke Father    Breast cancer Maternal Aunt 80   Breast cancer Niece     Social History   Socioeconomic History   Marital status: Widowed    Spouse name: Not on file   Number of children: Not on file   Years of education: Not on file   Highest education level: Not on file  Occupational History   Not on file  Tobacco Use   Smoking status: Never   Smokeless tobacco: Never  Vaping Use   Vaping status: Never Used  Substance and Sexual Activity   Alcohol use: Yes    Alcohol/week: 7.0 standard drinks of alcohol    Types: 7 Glasses of wine per week    Comment: wine   Drug use: No   Sexual activity: Not on file  Other Topics Concern   Not on file  Social History Narrative   Not on file   Social Drivers of Health   Financial Resource Strain: Not on file  Food Insecurity: Not on file  Transportation Needs: Not on file  Physical Activity: Not on file  Stress: Not on file  Social Connections: Not on file  Intimate Partner Violence: Not on file    Review of Systems  Constitutional: Negative.  Negative for chills, fever and malaise/fatigue.  HENT: Negative.    Eyes: Negative.   Respiratory: Negative.  Negative for cough and shortness of breath.   Cardiovascular: Negative.  Negative for chest pain, palpitations and leg swelling.  Gastrointestinal: Negative.  Negative for abdominal pain, constipation, diarrhea, heartburn, nausea and vomiting.  Genitourinary: Negative.  Negative for dysuria and flank pain.  Musculoskeletal: Negative.  Negative for joint pain and myalgias.  Skin: Negative.   Neurological: Negative.  Negative for dizziness and headaches.  Endo/Heme/Allergies: Negative.   Psychiatric/Behavioral: Negative.  Negative for depression and suicidal ideas. The patient is not nervous/anxious.         Objective   BP 118/72   Pulse 62   Ht 5' 7 (1.702 m)   Wt 144 lb (65.3 kg)   SpO2 98%   BMI 22.55 kg/m   Physical Exam Vitals and nursing note  reviewed.  Constitutional:      Appearance: Normal appearance.  HENT:     Head: Normocephalic and atraumatic.     Nose: Nose normal.     Mouth/Throat:     Mouth: Mucous membranes are moist.     Pharynx: Oropharynx is clear.  Eyes:     Conjunctiva/sclera: Conjunctivae normal.     Pupils: Pupils are equal, round, and reactive to light.  Cardiovascular:     Rate and Rhythm: Normal rate and regular rhythm.     Pulses: Normal pulses.     Heart sounds: Normal heart sounds. No murmur heard. Pulmonary:     Effort: Pulmonary effort is normal.     Breath sounds: Normal breath sounds. No wheezing.  Abdominal:  General: Bowel sounds are normal.     Palpations: Abdomen is soft.     Tenderness: There is no abdominal tenderness. There is no right CVA tenderness or left CVA tenderness.  Musculoskeletal:        General: Normal range of motion.     Cervical back: Normal range of motion.     Right lower leg: No edema.     Left lower leg: No edema.  Skin:    General: Skin is warm and dry.  Neurological:     General: No focal deficit present.     Mental Status: She is alert and oriented to person, place, and time.  Psychiatric:        Mood and Affect: Mood normal.        Behavior: Behavior normal.        Assessment & Plan:  Continue medications as prescribed. Will order DEXA and mammogram. Routine labs ordered, will review results with patient. FU in 3 months Problem List Items Addressed This Visit   None Visit Diagnoses       Type 2 diabetes mellitus without complication, without long-term current use of insulin (HCC)    -  Primary   Relevant Orders   POCT CBG (Fasting - Glucose) (Completed)     Screening for osteoporosis       Relevant Orders   HM DEXA SCAN (Completed)     Breast cancer screening by mammogram       Relevant Orders   MM 3D SCREENING MAMMOGRAM BILATERAL BREAST     Tachycardia       Relevant Orders   TSH+T4F+T3Free     Combined hyperlipidemia associated with  type 2 diabetes mellitus (HCC)       Relevant Orders   Lipid Panel w/o Chol/HDL Ratio     Hypertension associated with diabetes (HCC)       Relevant Orders   CBC with Diff   CMP14+EGFR       Follow up 3 month.   Total time spent: 30 minutes  FERNAND FREDY RAMAN, MD  05/14/2024   This document may have been prepared by [facility] Voice Recognition software and as such may include unintentional dictation errors.

## 2024-05-15 ENCOUNTER — Other Ambulatory Visit

## 2024-05-20 LAB — CMP14+EGFR
ALT: 26 IU/L (ref 0–32)
AST: 23 IU/L (ref 0–40)
Albumin: 4.2 g/dL (ref 3.8–4.8)
Alkaline Phosphatase: 64 IU/L (ref 44–121)
BUN/Creatinine Ratio: 19 (ref 12–28)
BUN: 14 mg/dL (ref 8–27)
Bilirubin Total: <0.2 mg/dL (ref 0.0–1.2)
CO2: 24 mmol/L (ref 20–29)
Calcium: 10 mg/dL (ref 8.7–10.3)
Chloride: 104 mmol/L (ref 96–106)
Creatinine, Ser: 0.74 mg/dL (ref 0.57–1.00)
Globulin, Total: 2.4 g/dL (ref 1.5–4.5)
Glucose: 95 mg/dL (ref 70–99)
Potassium: 5.3 mmol/L — ABNORMAL HIGH (ref 3.5–5.2)
Sodium: 139 mmol/L (ref 134–144)
Total Protein: 6.6 g/dL (ref 6.0–8.5)
eGFR: 84 mL/min/1.73 (ref >59)

## 2024-05-20 LAB — CBC WITH DIFFERENTIAL/PLATELET

## 2024-05-20 LAB — LIPID PANEL W/O CHOL/HDL RATIO
Cholesterol, Total: 189 mg/dL (ref 100–199)
HDL: 50 mg/dL (ref >39)
LDL Chol Calc (NIH): 119 mg/dL — ABNORMAL HIGH (ref 0–99)
Triglycerides: 112 mg/dL (ref 0–149)
VLDL Cholesterol Cal: 20 mg/dL (ref 5–40)

## 2024-05-20 LAB — TSH+T4F+T3FREE
Free T4: 1.02 ng/dL (ref 0.82–1.77)
T3, Free: 2.9 pg/mL (ref 2.0–4.4)
TSH: 2.56 u[IU]/mL (ref 0.450–4.500)

## 2024-05-21 ENCOUNTER — Ambulatory Visit: Payer: Self-pay | Admitting: Internal Medicine

## 2024-06-29 ENCOUNTER — Other Ambulatory Visit: Payer: Self-pay

## 2024-06-29 ENCOUNTER — Inpatient Hospital Stay: Attending: Oncology

## 2024-06-29 DIAGNOSIS — R197 Diarrhea, unspecified: Secondary | ICD-10-CM | POA: Diagnosis not present

## 2024-06-29 DIAGNOSIS — D473 Essential (hemorrhagic) thrombocythemia: Secondary | ICD-10-CM

## 2024-06-29 DIAGNOSIS — Z803 Family history of malignant neoplasm of breast: Secondary | ICD-10-CM | POA: Diagnosis not present

## 2024-06-29 LAB — CBC WITH DIFFERENTIAL (CANCER CENTER ONLY)
Abs Immature Granulocytes: 0.01 K/uL (ref 0.00–0.07)
Basophils Absolute: 0 K/uL (ref 0.0–0.1)
Basophils Relative: 1 %
Eosinophils Absolute: 0.2 K/uL (ref 0.0–0.5)
Eosinophils Relative: 3 %
HCT: 38.5 % (ref 36.0–46.0)
Hemoglobin: 13.1 g/dL (ref 12.0–15.0)
Immature Granulocytes: 0 %
Lymphocytes Relative: 23 %
Lymphs Abs: 1.1 K/uL (ref 0.7–4.0)
MCH: 36.4 pg — ABNORMAL HIGH (ref 26.0–34.0)
MCHC: 34 g/dL (ref 30.0–36.0)
MCV: 106.9 fL — ABNORMAL HIGH (ref 80.0–100.0)
Monocytes Absolute: 0.4 K/uL (ref 0.1–1.0)
Monocytes Relative: 9 %
Neutro Abs: 3 K/uL (ref 1.7–7.7)
Neutrophils Relative %: 64 %
Platelet Count: 265 K/uL (ref 150–400)
RBC: 3.6 MIL/uL — ABNORMAL LOW (ref 3.87–5.11)
RDW: 12.6 % (ref 11.5–15.5)
WBC Count: 4.7 K/uL (ref 4.0–10.5)
nRBC: 0 % (ref 0.0–0.2)

## 2024-07-01 ENCOUNTER — Telehealth: Admitting: Oncology

## 2024-07-01 ENCOUNTER — Other Ambulatory Visit

## 2024-07-06 ENCOUNTER — Encounter: Payer: Self-pay | Admitting: Oncology

## 2024-07-06 ENCOUNTER — Inpatient Hospital Stay: Admitting: Oncology

## 2024-07-06 VITALS — BP 124/68 | HR 57 | Temp 96.7°F | Resp 18 | Ht 67.0 in | Wt 142.0 lb

## 2024-07-06 DIAGNOSIS — D473 Essential (hemorrhagic) thrombocythemia: Secondary | ICD-10-CM | POA: Diagnosis not present

## 2024-07-06 NOTE — Progress Notes (Signed)
 Patient is having some diarrhea, she wants to know if it is from the hydroxyurea .

## 2024-07-06 NOTE — Progress Notes (Signed)
 [facility]  [phone] [phone]  ID: Whitney Bowers Bring OB: 02/06/1946  MR#: [id number]  RDW#:[id number]  Patient Care Team: Fernand Fredy RAMAN, MD as PCP - General (Internal Medicine) Jacobo Evalene PARAS, MD as Consulting Physician (Oncology)  CHIEF COMPLAINT: JAK2 positive essential thrombocytosis.  INTERVAL HISTORY: Patient returns to clinic today for repeat laboratory work and further evaluation.  She states she has intermittent diarrhea, but otherwise feels well.  She is tolerating Hydrea  without significant side effects.  She has no neurologic complaints.  She denies any recent fevers or illnesses.  She has a good appetite and denies weight loss.  She has no chest pain, shortness of breath, cough, or hemoptysis.  She denies any nausea, vomiting, or constipation.  She has no urinary complaints.  Patient offers no further specific complaints today.  REVIEW OF SYSTEMS:   Review of Systems  Constitutional: Negative.  Negative for fever, malaise/fatigue and weight loss.  Respiratory: Negative.  Negative for cough, hemoptysis and shortness of breath.   Cardiovascular: Negative.  Negative for chest pain and leg swelling.  Gastrointestinal:  Positive for diarrhea. Negative for abdominal pain.  Genitourinary: Negative.  Negative for dysuria.  Musculoskeletal: Negative.  Negative for back pain.  Skin: Negative.  Negative for rash.  Neurological: Negative.  Negative for dizziness, focal weakness, weakness and headaches.  Psychiatric/Behavioral: Negative.  The patient is not nervous/anxious.     As per HPI. Otherwise, a complete review of systems is negative.  PAST MEDICAL HISTORY: Past Medical History:  Diagnosis Date   Arthritis    Atrial tachycardia    Diabetes mellitus without complication (HCC)    Dysplastic nevus 09/20/2009   Left mid volar forearm. Mild atypia.   Hyperglycemia    Hyperlipidemia    Hypertension    Hypothyroidism     Osteopenia    Palpitations    Pre-syncope    Sinus bradycardia    Wears hearing aid in both ears     PAST SURGICAL HISTORY: Past Surgical History:  Procedure Laterality Date   CATARACT EXTRACTION W/PHACO Left 09/23/2023   Procedure: CATARACT EXTRACTION PHACO AND INTRAOCULAR LENS PLACEMENT (IOC) LEFT DIABETIC 10.63 00:57.6;  Surgeon: Myrna Adine Anes, MD;  Location: [facility];  Service: Ophthalmology;  Laterality: Left;   CATARACT EXTRACTION W/PHACO Right 10/07/2023   Procedure: CATARACT EXTRACTION PHACO AND INTRAOCULAR LENS PLACEMENT (IOC) RIGHT DIABETIC 9.37, 00:53.8;  Surgeon: Myrna Adine Anes, MD;  Location: [facility];  Service: Ophthalmology;  Laterality: Right;   CESAREAN SECTION     1979 and 1982   COLONOSCOPY WITH PROPOFOL  N/A 08/23/2016   Procedure: COLONOSCOPY WITH PROPOFOL ;  Surgeon: Rogelia Copping, MD;  Location: [facility];  Service: Endoscopy;  Laterality: N/A;  diabetic    COLONOSCOPY WITH PROPOFOL  N/A 04/24/2022   Procedure: COLONOSCOPY WITH PROPOFOL ;  Surgeon: Copping Rogelia, MD;  Location: [facility];  Service: Endoscopy;  Laterality: N/A;   MANDIBLE SURGERY     POLYPECTOMY N/A 08/23/2016   Procedure: POLYPECTOMY;  Surgeon: Rogelia Copping, MD;  Location: [facility];  Service: Endoscopy;  Laterality: N/A;    FAMILY HISTORY: Family History  Problem Relation Age of Onset   Atrial fibrillation Mother    Hyperlipidemia Mother    Hypertension Mother    Hypertension Father    Stroke Father    Breast cancer Maternal Aunt 34   Breast cancer Niece     ADVANCED DIRECTIVES (Y/N):  N  HEALTH MAINTENANCE: Social History   Tobacco Use  Smoking status: Never   Smokeless tobacco: Never  Vaping Use   Vaping status: Never Used  Substance Use Topics   Alcohol use: Yes    Alcohol/week: 7.0 standard drinks of alcohol    Types: 7 Glasses of wine per week    Comment: wine   Drug use: No     Colonoscopy:  PAP:  Bone  density:  Lipid panel:  Allergies  Allergen Reactions   Penicillins     Blistered face   Tape     blisters    Current Outpatient Medications  Medication Sig Dispense Refill   atorvastatin (LIPITOR) 10 MG tablet Take 10 mg by mouth daily.      flecainide  (TAMBOCOR ) 50 MG tablet Take 0.5 tablets (25 mg total) by mouth 2 (two) times daily. (Patient taking differently: Take 25 mg by mouth as needed.) 180 tablet 1   hydroxyurea  (HYDREA ) 500 MG capsule Take 1 capsule (500 mg total) by mouth daily. May take with food to minimize GI side effects. 90 capsule 3   metFORMIN (GLUCOPHAGE) 500 MG tablet Takes 2 tablets am and 1 tablet pm daily.     metoprolol tartrate (LOPRESSOR) 25 MG tablet Take 25 mg by mouth 2 (two) times daily.     No current facility-administered medications for this visit.    OBJECTIVE: Vitals:   07/06/24 1034  BP: 124/68  Pulse: (!) 57  Resp: 18  Temp: (!) 96.7 F (35.9 C)  SpO2: 98%      Body mass index is 22.24 kg/m.    ECOG FS:0 - Asymptomatic  General: Well-developed, well-nourished, no acute distress. Eyes: Pink conjunctiva, anicteric sclera. HEENT: Normocephalic, moist mucous membranes. Lungs: No audible wheezing or coughing. Heart: Regular rate and rhythm. Abdomen: Soft, nontender, no obvious distention. Musculoskeletal: No edema, cyanosis, or clubbing. Neuro: Alert, answering all questions appropriately. Cranial nerves grossly intact. Skin: No rashes or petechiae noted. Psych: Normal affect.  LAB RESULTS:  Lab Results  Component Value Date   NA 139 05/15/2024   K 5.3 (H) 05/15/2024   CL 104 05/15/2024   CO2 24 05/15/2024   GLUCOSE 95 05/15/2024   BUN 14 05/15/2024   CREATININE 0.74 05/15/2024   CALCIUM 10.0 05/15/2024   PROT 6.6 05/15/2024   ALBUMIN 4.2 05/15/2024   AST 23 05/15/2024   ALT 26 05/15/2024   ALKPHOS 64 05/15/2024   BILITOT <0.2 05/15/2024    Lab Results  Component Value Date   WBC 4.7 06/29/2024   NEUTROABS 3.0  06/29/2024   HGB 13.1 06/29/2024   HCT 38.5 06/29/2024   MCV 106.9 (H) 06/29/2024   PLT 265 06/29/2024   Lab Results  Component Value Date   IRON 57 12/10/2023   TIBC 398 12/10/2023   IRONPCTSAT 14 12/10/2023   Lab Results  Component Value Date   FERRITIN 20 12/10/2023     STUDIES: No results found.   ASSESSMENT: JAK2 positive essential thrombocytosis.  PLAN:    JAK2 positive essential thrombocytosis: Patient initiated 500 mg Hydrea  daily in April 2025.  Platelet count continues to be within normal limits. Previously, all of her laboratory work is either negative or within normal limits.  IntelliGEN myeloid also picked up the JAK2 mutation.  No further intervention is needed.  Patient does not require bone marrow biopsy.  Continue 500 mg Hydrea  daily as prescribed.  Return to clinic in 3 months for laboratory work and routine evaluation. Diarrhea: Intermittent.  Unclear if related to Hydrea .  We briefly discussed dose  reducing Hydrea  to 5 days a week, but patient has elected to continue treatment as prescribed.  Continue to use Imodium OTC as needed.  Patient expressed understanding and was in agreement with this plan. She also understands that She can call clinic at any time with any questions, concerns, or complaints.     Evalene JINNY Reusing, MD   07/06/2024 11:08 AM

## 2024-08-17 ENCOUNTER — Ambulatory Visit: Admitting: Internal Medicine

## 2024-08-17 ENCOUNTER — Encounter: Payer: Self-pay | Admitting: Internal Medicine

## 2024-08-17 VITALS — BP 122/74 | HR 73 | Ht 67.0 in | Wt 144.8 lb

## 2024-08-17 DIAGNOSIS — E1165 Type 2 diabetes mellitus with hyperglycemia: Secondary | ICD-10-CM | POA: Diagnosis not present

## 2024-08-17 DIAGNOSIS — E1169 Type 2 diabetes mellitus with other specified complication: Secondary | ICD-10-CM | POA: Diagnosis not present

## 2024-08-17 DIAGNOSIS — R000 Tachycardia, unspecified: Secondary | ICD-10-CM | POA: Diagnosis not present

## 2024-08-17 DIAGNOSIS — Z13820 Encounter for screening for osteoporosis: Secondary | ICD-10-CM

## 2024-08-17 DIAGNOSIS — I152 Hypertension secondary to endocrine disorders: Secondary | ICD-10-CM | POA: Diagnosis not present

## 2024-08-17 DIAGNOSIS — E119 Type 2 diabetes mellitus without complications: Secondary | ICD-10-CM

## 2024-08-17 DIAGNOSIS — E1159 Type 2 diabetes mellitus with other circulatory complications: Secondary | ICD-10-CM

## 2024-08-17 DIAGNOSIS — E782 Mixed hyperlipidemia: Secondary | ICD-10-CM | POA: Diagnosis not present

## 2024-08-17 LAB — POC CREATINE & ALBUMIN,URINE
Albumin/Creatinine Ratio, Urine, POC: <30
Creatinine, POC: 50 mg/dL
Microalbumin Ur, POC: 10 mg/L

## 2024-08-17 LAB — POCT CBG (FASTING - GLUCOSE)-MANUAL ENTRY: Glucose Fasting, POC: 115 mg/dL — AB (ref 70–99)

## 2024-08-17 MED ORDER — ATORVASTATIN CALCIUM 10 MG PO TABS: 10.0000 mg | ORAL_TABLET | Freq: Every day | ORAL | 3 refills | Status: DC

## 2024-08-17 MED ORDER — METOPROLOL TARTRATE 25 MG PO TABS: 25.0000 mg | ORAL_TABLET | Freq: Two times a day (BID) | ORAL | 3 refills | Status: AC

## 2024-08-17 MED ORDER — METFORMIN HCL 500 MG PO TABS: ORAL_TABLET | ORAL | 3 refills | Status: AC

## 2024-08-17 NOTE — Progress Notes (Signed)
 Established Patient Office Visit  Subjective:  Patient ID: Whitney Bowers, female    DOB: July 19, 1946  Age: 78 y.o. MRN: [id number]  Chief Complaint  Patient presents with   Follow-up    3 month follow up    Patient is here today for follow up. She reports doing well and having no new complaints at this time. She does endorse palpitations frequently and having taken her PRN Flecainide  a few times in the last 2 weeks. Denies any palpitations at this time. She is due for routine fasting blood work today. Will add on UAC as well.  Patient has not gotten mammogram scheduled but will provide patient with their contact information to make an appointment. She is also due for DEXA scan so will get that scheduled.  Patient would like a flu shot but wants to defer it today due to traveling soon.     No other concerns at this time.   Past Medical History:  Diagnosis Date   Arthritis    Atrial tachycardia    Diabetes mellitus without complication (HCC)    Dysplastic nevus 09/20/2009   Left mid volar forearm. Mild atypia.   Hyperglycemia    Hyperlipidemia    Hypertension    Hypothyroidism    Osteopenia    Palpitations    Pre-syncope    Sinus bradycardia    Wears hearing aid in both ears     Past Surgical History:  Procedure Laterality Date   CATARACT EXTRACTION W/PHACO Left 09/23/2023   Procedure: CATARACT EXTRACTION PHACO AND INTRAOCULAR LENS PLACEMENT (IOC) LEFT DIABETIC 10.63 00:57.6;  Surgeon: Myrna Adine Anes, MD;  Location: [facility];  Service: Ophthalmology;  Laterality: Left;   CATARACT EXTRACTION W/PHACO Right 10/07/2023   Procedure: CATARACT EXTRACTION PHACO AND INTRAOCULAR LENS PLACEMENT (IOC) RIGHT DIABETIC 9.37, 00:53.8;  Surgeon: Myrna Adine Anes, MD;  Location: [facility];  Service: Ophthalmology;  Laterality: Right;   CESAREAN SECTION     1979 and 1982   COLONOSCOPY WITH PROPOFOL  N/A 08/23/2016   Procedure: COLONOSCOPY WITH PROPOFOL ;   Surgeon: Rogelia Copping, MD;  Location: [facility];  Service: Endoscopy;  Laterality: N/A;  diabetic    COLONOSCOPY WITH PROPOFOL  N/A 04/24/2022   Procedure: COLONOSCOPY WITH PROPOFOL ;  Surgeon: Copping Rogelia, MD;  Location: [facility] END[facility];  Service: Endoscopy;  Laterality: N/A;   MANDIBLE SURGERY     POLYPECTOMY N/A 08/23/2016   Procedure: POLYPECTOMY;  Surgeon: Rogelia Copping, MD;  Location: [facility];  Service: Endoscopy;  Laterality: N/A;    Social History   Socioeconomic History   Marital status: Widowed    Spouse name: Not on file   Number of children: Not on file   Years of education: Not on file   Highest education level: Not on file  Occupational History   Not on file  Tobacco Use   Smoking status: Never   Smokeless tobacco: Never  Vaping Use   Vaping status: Never Used  Substance and Sexual Activity   Alcohol use: Yes    Alcohol/week: 7.0 standard drinks of alcohol    Types: 7 Glasses of wine per week    Comment: wine   Drug use: No   Sexual activity: Not on file  Other Topics Concern   Not on file  Social History Narrative   Not on file   Social Drivers of Health   Financial Resource Strain: Not on file  Food Insecurity: Not on file  Transportation Needs: Not on file  Physical Activity: Not on file  Stress: Not on file  Social Connections: Not on file  Intimate Partner Violence: Not on file    Family History  Problem Relation Age of Onset   Atrial fibrillation Mother    Hyperlipidemia Mother    Hypertension Mother    Hypertension Father    Stroke Father    Breast cancer Maternal Aunt 82   Breast cancer Niece     Allergies  Allergen Reactions   Penicillins     Blistered face   Tape     blisters    Outpatient Medications Prior to Visit  Medication Sig   flecainide  (TAMBOCOR ) 50 MG tablet Take 0.5 tablets (25 mg total) by mouth 2 (two) times daily. (Patient taking differently: Take 25 mg by mouth as needed.)   hydroxyurea   (HYDREA ) 500 MG capsule Take 1 capsule (500 mg total) by mouth daily. May take with food to minimize GI side effects.   [DISCONTINUED] atorvastatin (LIPITOR) 10 MG tablet Take 10 mg by mouth daily.    [DISCONTINUED] metFORMIN (GLUCOPHAGE) 500 MG tablet Takes 2 tablets am and 1 tablet pm daily.   [DISCONTINUED] metoprolol tartrate (LOPRESSOR) 25 MG tablet Take 25 mg by mouth 2 (two) times daily.   No facility-administered medications prior to visit.    Review of Systems  Constitutional: Negative.  Negative for chills, fever and malaise/fatigue.  HENT: Negative.  Negative for congestion and sore throat.   Eyes: Negative.  Negative for blurred vision and pain.  Respiratory: Negative.  Negative for cough and shortness of breath.   Cardiovascular: Negative.  Negative for chest pain, palpitations and leg swelling.  Gastrointestinal: Negative.  Negative for abdominal pain, blood in stool, constipation, diarrhea, heartburn, melena, nausea and vomiting.  Genitourinary: Negative.  Negative for dysuria, flank pain, frequency and urgency.  Musculoskeletal: Negative.  Negative for joint pain and myalgias.  Skin: Negative.   Neurological: Negative.  Negative for dizziness, tingling, sensory change, weakness and headaches.  Endo/Heme/Allergies: Negative.   Psychiatric/Behavioral: Negative.  Negative for depression and suicidal ideas. The patient is not nervous/anxious.        Objective:   BP 122/74   Pulse 73   Ht 5' 7 (1.702 m)   Wt 144 lb 12.8 oz (65.7 kg)   SpO2 98%   BMI 22.68 kg/m   Vitals:   08/17/24 0948  BP: 122/74  Pulse: 73  Height: 5' 7 (1.702 m)  Weight: 144 lb 12.8 oz (65.7 kg)  SpO2: 98%  BMI (Calculated): 22.67    Physical Exam Vitals and nursing note reviewed.  Constitutional:      Appearance: Normal appearance.  HENT:     Head: Normocephalic and atraumatic.     Nose: Nose normal.     Mouth/Throat:     Mouth: Mucous membranes are moist.     Pharynx: Oropharynx  is clear.  Eyes:     Conjunctiva/sclera: Conjunctivae normal.     Pupils: Pupils are equal, round, and reactive to light.  Cardiovascular:     Rate and Rhythm: Normal rate and regular rhythm.     Pulses: Normal pulses.     Heart sounds: Normal heart sounds. No murmur heard. Pulmonary:     Effort: Pulmonary effort is normal.     Breath sounds: Normal breath sounds. No wheezing.  Abdominal:     General: Bowel sounds are normal.     Palpations: Abdomen is soft.     Tenderness: There is no abdominal tenderness. There is  no right CVA tenderness or left CVA tenderness.  Musculoskeletal:        General: Normal range of motion.     Cervical back: Normal range of motion.     Right lower leg: No edema.     Left lower leg: No edema.  Skin:    General: Skin is warm and dry.  Neurological:     General: No focal deficit present.     Mental Status: She is alert and oriented to person, place, and time.  Psychiatric:        Mood and Affect: Mood normal.        Behavior: Behavior normal.      Results for orders placed or performed in visit on 08/17/24  POCT CBG (Fasting - Glucose)  Result Value Ref Range   Glucose Fasting, POC 115 (A) 70 - 99 mg/dL  POC CREATINE & ALBUMIN,URINE  Result Value Ref Range   Microalbumin Ur, POC 10 mg/L   Creatinine, POC 50 mg/dL   Albumin/Creatinine Ratio, Urine, POC <30     Recent Results (from the past 2160 hours)  CBC with Differential (Cancer Center Only)     Status: Abnormal   Collection Time: 06/29/24  1:39 PM  Result Value Ref Range   WBC Count 4.7 4.0 - 10.5 K/uL   RBC 3.60 (L) 3.87 - 5.11 MIL/uL   Hemoglobin 13.1 12.0 - 15.0 g/dL   HCT 61.4 63.9 - 53.9 %   MCV 106.9 (H) 80.0 - 100.0 fL   MCH 36.4 (H) 26.0 - 34.0 pg   MCHC 34.0 30.0 - 36.0 g/dL   RDW 87.3 88.4 - 84.4 %   Platelet Count 265 150 - 400 K/uL   nRBC 0.0 0.0 - 0.2 %   Neutrophils Relative % 64 %   Neutro Abs 3.0 1.7 - 7.7 K/uL   Lymphocytes Relative 23 %   Lymphs Abs 1.1 0.7 -  4.0 K/uL   Monocytes Relative 9 %   Monocytes Absolute 0.4 0.1 - 1.0 K/uL   Eosinophils Relative 3 %   Eosinophils Absolute 0.2 0.0 - 0.5 K/uL   Basophils Relative 1 %   Basophils Absolute 0.0 0.0 - 0.1 K/uL   Immature Granulocytes 0 %   Abs Immature Granulocytes 0.01 0.00 - 0.07 K/uL    Comment: Performed at [facility], [address] [place], [place] [address]  POCT CBG (Fasting - Glucose)     Status: Abnormal   Collection Time: 08/17/24  9:52 AM  Result Value Ref Range   Glucose Fasting, POC 115 (A) 70 - 99 mg/dL  POC CREATINE & ALBUMIN,URINE     Status: Normal   Collection Time: 08/17/24 10:04 AM  Result Value Ref Range   Microalbumin Ur, POC 10 mg/L   Creatinine, POC 50 mg/dL   Albumin/Creatinine Ratio, Urine, POC <30       Assessment & Plan:  Refills sent. Patient provided with mammogram contact information. DEXA scan to be scheduled. Routine fasting blood work today and FU with patient on results. Continue taking medications as prescribed. Reinforced healthy diet and exercise as tolerated. Problem List Items Addressed This Visit     Type 2 diabetes mellitus without complication, without long-term current use of insulin (HCC)   Relevant Medications   atorvastatin (LIPITOR) 10 MG tablet   metFORMIN (GLUCOPHAGE) 500 MG tablet   Other Relevant Orders   POCT CBG (Fasting - Glucose) (Completed)   CMP14+EGFR   POC CREATINE & ALBUMIN,URINE (Completed)  Screening for osteoporosis   Relevant Orders   DG Bone Density   Tachycardia   Relevant Medications   metoprolol tartrate (LOPRESSOR) 25 MG tablet   Other Relevant Orders   TSH+T4F+T3Free   Magnesium   Combined hyperlipidemia associated with type 2 diabetes mellitus (HCC)   Relevant Medications   atorvastatin (LIPITOR) 10 MG tablet   metFORMIN (GLUCOPHAGE) 500 MG tablet   metoprolol tartrate (LOPRESSOR) 25 MG tablet   Other Relevant Orders   Lipid Panel w/o Chol/HDL Ratio   Hypertension associated with  diabetes (HCC) - Primary   Relevant Medications   atorvastatin (LIPITOR) 10 MG tablet   metFORMIN (GLUCOPHAGE) 500 MG tablet   metoprolol tartrate (LOPRESSOR) 25 MG tablet   Other Relevant Orders   CMP14+EGFR   CBC with Diff    Return in about 4 months (around 12/16/2024).   Total time spent: 25 minutes. This time includes review of previous notes and results and patient face to face interaction during today's visit.    FERNAND FREDY RAMAN, MD  08/17/2024   This document may have been prepared by [facility] Voice Recognition software and as such may include unintentional dictation errors.

## 2024-08-18 ENCOUNTER — Ambulatory Visit: Payer: Self-pay | Admitting: Internal Medicine

## 2024-08-18 DIAGNOSIS — E1169 Type 2 diabetes mellitus with other specified complication: Secondary | ICD-10-CM

## 2024-08-18 LAB — CBC WITH DIFFERENTIAL/PLATELET
Basophils Absolute: 0 x10E3/uL (ref 0.0–0.2)
Basos: 1 %
EOS (ABSOLUTE): 0.1 x10E3/uL (ref 0.0–0.4)
Eos: 3 %
Hematocrit: 40 % (ref 34.0–46.6)
Hemoglobin: 13.6 g/dL (ref 11.1–15.9)
Immature Grans (Abs): 0 x10E3/uL (ref 0.0–0.1)
Immature Granulocytes: 0 %
Lymphocytes Absolute: 0.9 x10E3/uL (ref 0.7–3.1)
Lymphs: 20 %
MCH: 36.9 pg — ABNORMAL HIGH (ref 26.6–33.0)
MCHC: 34 g/dL (ref 31.5–35.7)
MCV: 108 fL — ABNORMAL HIGH (ref 79–97)
Monocytes Absolute: 0.4 x10E3/uL (ref 0.1–0.9)
Monocytes: 9 %
Neutrophils Absolute: 3.2 x10E3/uL (ref 1.4–7.0)
Neutrophils: 67 %
Platelets: 259 x10E3/uL (ref 150–450)
RBC: 3.69 x10E6/uL — ABNORMAL LOW (ref 3.77–5.28)
RDW: 13.2 % (ref 11.7–15.4)
WBC: 4.7 x10E3/uL (ref 3.4–10.8)

## 2024-08-18 LAB — LIPID PANEL W/O CHOL/HDL RATIO
Cholesterol, Total: 171 mg/dL (ref 100–199)
HDL: 48 mg/dL (ref >39)
LDL Chol Calc (NIH): 101 mg/dL — ABNORMAL HIGH (ref 0–99)
Triglycerides: 125 mg/dL (ref 0–149)
VLDL Cholesterol Cal: 22 mg/dL (ref 5–40)

## 2024-08-18 LAB — CMP14+EGFR
ALT: 19 IU/L (ref 0–32)
AST: 19 IU/L (ref 0–40)
Albumin: 4.2 g/dL (ref 3.8–4.8)
Alkaline Phosphatase: 64 IU/L (ref 49–135)
BUN/Creatinine Ratio: 16 (ref 12–28)
BUN: 12 mg/dL (ref 8–27)
Bilirubin Total: 0.4 mg/dL (ref 0.0–1.2)
CO2: 23 mmol/L (ref 20–29)
Calcium: 10.2 mg/dL (ref 8.7–10.3)
Chloride: 102 mmol/L (ref 96–106)
Creatinine, Ser: 0.77 mg/dL (ref 0.57–1.00)
Globulin, Total: 2.3 g/dL (ref 1.5–4.5)
Glucose: 102 mg/dL — ABNORMAL HIGH (ref 70–99)
Potassium: 4.7 mmol/L (ref 3.5–5.2)
Sodium: 138 mmol/L (ref 134–144)
Total Protein: 6.5 g/dL (ref 6.0–8.5)
eGFR: 80 mL/min/1.73 (ref >59)

## 2024-08-18 LAB — TSH+T4F+T3FREE
Free T4: 0.96 ng/dL (ref 0.82–1.77)
T3, Free: 2.6 pg/mL (ref 2.0–4.4)
TSH: 2.1 u[IU]/mL (ref 0.450–4.500)

## 2024-08-18 LAB — MAGNESIUM: Magnesium: 1.9 mg/dL (ref 1.6–2.3)

## 2024-08-18 MED ORDER — ATORVASTATIN CALCIUM 20 MG PO TABS: 20.0000 mg | ORAL_TABLET | Freq: Every day | ORAL | 3 refills | Status: AC

## 2024-08-19 NOTE — Progress Notes (Signed)
 Patient notified

## 2024-08-24 ENCOUNTER — Encounter: Payer: Self-pay | Admitting: Internal Medicine

## 2024-09-02 ENCOUNTER — Other Ambulatory Visit

## 2024-09-24 ENCOUNTER — Ambulatory Visit
Admission: RE | Admit: 2024-09-24 | Discharge: 2024-09-24 | Disposition: A | Discharge status: Home / Self Care | Source: Ambulatory Visit | Attending: Internal Medicine | Admitting: Internal Medicine

## 2024-09-24 DIAGNOSIS — Z1231 Encounter for screening mammogram for malignant neoplasm of breast: Secondary | ICD-10-CM | POA: Diagnosis present

## 2024-10-08 ENCOUNTER — Inpatient Hospital Stay: Attending: Oncology | Admitting: Oncology

## 2024-10-08 ENCOUNTER — Encounter: Payer: Self-pay | Admitting: Oncology

## 2024-10-08 ENCOUNTER — Inpatient Hospital Stay

## 2024-10-08 VITALS — BP 135/77 | HR 61 | Temp 97.2°F | Resp 18 | Ht 67.0 in | Wt 145.0 lb

## 2024-10-08 DIAGNOSIS — D75839 Thrombocytosis, unspecified: Secondary | ICD-10-CM | POA: Diagnosis not present

## 2024-10-08 DIAGNOSIS — D473 Essential (hemorrhagic) thrombocythemia: Secondary | ICD-10-CM

## 2024-10-08 LAB — CBC WITH DIFFERENTIAL/PLATELET
Abs Immature Granulocytes: 0.02 K/uL (ref 0.00–0.07)
Basophils Absolute: 0 K/uL (ref 0.0–0.1)
Basophils Relative: 1 %
Eosinophils Absolute: 0.1 K/uL (ref 0.0–0.5)
Eosinophils Relative: 3 %
HCT: 39.2 % (ref 36.0–46.0)
Hemoglobin: 13.3 g/dL (ref 12.0–15.0)
Immature Granulocytes: 0 %
Lymphocytes Relative: 22 %
Lymphs Abs: 1 K/uL (ref 0.7–4.0)
MCH: 37 pg — ABNORMAL HIGH (ref 26.0–34.0)
MCHC: 33.9 g/dL (ref 30.0–36.0)
MCV: 109.2 fL — ABNORMAL HIGH (ref 80.0–100.0)
Monocytes Absolute: 0.6 K/uL (ref 0.1–1.0)
Monocytes Relative: 13 %
Neutro Abs: 2.7 K/uL (ref 1.7–7.7)
Neutrophils Relative %: 61 %
Platelets: 246 K/uL (ref 150–400)
RBC: 3.59 MIL/uL — ABNORMAL LOW (ref 3.87–5.11)
RDW: 13.2 % (ref 11.5–15.5)
WBC: 4.5 K/uL (ref 4.0–10.5)
nRBC: 0 % (ref 0.0–0.2)

## 2024-10-08 NOTE — Progress Notes (Signed)
 " [facility]  [phone] [phone]) [phone]  ID: Whitney Bowers Bring OB: Feb 02, 1946  MR#: [id number]  RDW#:[id number]  Patient Care Team: Fernand Fredy RAMAN, MD as PCP - General (Internal Medicine) Jacobo Evalene PARAS, MD as Consulting Physician (Oncology)  CHIEF COMPLAINT: JAK2 positive essential thrombocytosis.  INTERVAL HISTORY: Patient returns to clinic today for repeat laboratory and further evaluation.  She continues to have intermittent diarrhea, but otherwise feels well.  She continues to tolerate Hydrea  without significant side effects.  She has no neurologic complaints.  She denies any recent fevers or illnesses.  She has a good appetite and denies weight loss.  She has no chest pain, shortness of breath, cough, or hemoptysis.  She denies any nausea, vomiting, or constipation.  She has no urinary complaints.  Patient offers no further specific complaints today.  REVIEW OF SYSTEMS:   Review of Systems  Constitutional: Negative.  Negative for fever, malaise/fatigue and weight loss.  Respiratory: Negative.  Negative for cough, hemoptysis and shortness of breath.   Cardiovascular: Negative.  Negative for chest pain and leg swelling.  Gastrointestinal:  Positive for diarrhea. Negative for abdominal pain.  Genitourinary: Negative.  Negative for dysuria.  Musculoskeletal: Negative.  Negative for back pain.  Skin: Negative.  Negative for rash.  Neurological: Negative.  Negative for dizziness, focal weakness, weakness and headaches.  Psychiatric/Behavioral: Negative.  The patient is not nervous/anxious.     As per HPI. Otherwise, a complete review of systems is negative.  PAST MEDICAL HISTORY: Past Medical History:  Diagnosis Date   Arthritis    Atrial tachycardia    Diabetes mellitus without complication (HCC)    Dysplastic nevus 09/20/2009   Left mid volar forearm. Mild atypia.   Hyperglycemia    Hyperlipidemia    Hypertension    Hypothyroidism     Osteopenia    Palpitations    Pre-syncope    Sinus bradycardia    Wears hearing aid in both ears     PAST SURGICAL HISTORY: Past Surgical History:  Procedure Laterality Date   CATARACT EXTRACTION W/PHACO Left 09/23/2023   Procedure: CATARACT EXTRACTION PHACO AND INTRAOCULAR LENS PLACEMENT (IOC) LEFT DIABETIC 10.63 00:57.6;  Surgeon: Myrna Adine Anes, MD;  Location: [facility];  Service: Ophthalmology;  Laterality: Left;   CATARACT EXTRACTION W/PHACO Right 10/07/2023   Procedure: CATARACT EXTRACTION PHACO AND INTRAOCULAR LENS PLACEMENT (IOC) RIGHT DIABETIC 9.37, 00:53.8;  Surgeon: Myrna Adine Anes, MD;  Location: [facility];  Service: Ophthalmology;  Laterality: Right;   CESAREAN SECTION     1979 and 1982   COLONOSCOPY WITH PROPOFOL  N/A 08/23/2016   Procedure: COLONOSCOPY WITH PROPOFOL ;  Surgeon: Rogelia Copping, MD;  Location: [facility];  Service: Endoscopy;  Laterality: N/A;  diabetic    COLONOSCOPY WITH PROPOFOL  N/A 04/24/2022   Procedure: COLONOSCOPY WITH PROPOFOL ;  Surgeon: Copping Rogelia, MD;  Location: [facility];  Service: Endoscopy;  Laterality: N/A;   MANDIBLE SURGERY     POLYPECTOMY N/A 08/23/2016   Procedure: POLYPECTOMY;  Surgeon: Rogelia Copping, MD;  Location: [facility];  Service: Endoscopy;  Laterality: N/A;    FAMILY HISTORY: Family History  Problem Relation Age of Onset   Atrial fibrillation Mother    Hyperlipidemia Mother    Hypertension Mother    Hypertension Father    Stroke Father    Breast cancer Maternal Aunt 76   Breast cancer Niece     ADVANCED DIRECTIVES (Y/N):  N  HEALTH MAINTENANCE: Social History   Tobacco  Use   Smoking status: Never   Smokeless tobacco: Never  Vaping Use   Vaping status: Never Used  Substance Use Topics   Alcohol use: Yes    Alcohol/week: 7.0 standard drinks of alcohol    Types: 7 Glasses of wine per week    Comment: wine   Drug use: No     Colonoscopy:  PAP:  Bone  density:  Lipid panel:  Allergies  Allergen Reactions   Penicillins     Blistered face   Tape     blisters    Current Outpatient Medications  Medication Sig Dispense Refill   atorvastatin  (LIPITOR) 20 MG tablet Take 1 tablet (20 mg total) by mouth daily. 90 tablet 3   flecainide  (TAMBOCOR ) 50 MG tablet Take 0.5 tablets (25 mg total) by mouth 2 (two) times daily. (Patient taking differently: Take 25 mg by mouth as needed.) 180 tablet 1   hydroxyurea  (HYDREA ) 500 MG capsule Take 1 capsule (500 mg total) by mouth daily. May take with food to minimize GI side effects. 90 capsule 3   metFORMIN  (GLUCOPHAGE ) 500 MG tablet Takes 2 tablets morning and 1 tablet in the evening daily. 100 tablet 3   metoprolol  tartrate (LOPRESSOR ) 25 MG tablet Take 1 tablet (25 mg total) by mouth 2 (two) times daily. 180 tablet 3   No current facility-administered medications for this visit.    OBJECTIVE: Vitals:   10/08/24 1032  BP: 135/77  Pulse: 61  Resp: 18  Temp: (!) 97.2 F (36.2 C)  SpO2: 100%      Body mass index is 22.71 kg/m.    ECOG FS:0 - Asymptomatic  General: Well-developed, well-nourished, no acute distress. Eyes: Pink conjunctiva, anicteric sclera. HEENT: Normocephalic, moist mucous membranes. Lungs: No audible wheezing or coughing. Heart: Regular rate and rhythm. Abdomen: Soft, nontender, no obvious distention. Musculoskeletal: No edema, cyanosis, or clubbing. Neuro: Alert, answering all questions appropriately. Cranial nerves grossly intact. Skin: No rashes or petechiae noted. Psych: Normal affect.  LAB RESULTS:  Lab Results  Component Value Date   NA 138 08/17/2024   K 4.7 08/17/2024   CL 102 08/17/2024   CO2 23 08/17/2024   GLUCOSE 102 (H) 08/17/2024   BUN 12 08/17/2024   CREATININE 0.77 08/17/2024   CALCIUM  10.2 08/17/2024   PROT 6.5 08/17/2024   ALBUMIN 4.2 08/17/2024   AST 19 08/17/2024   ALT 19 08/17/2024   ALKPHOS 64 08/17/2024   BILITOT 0.4 08/17/2024     Lab Results  Component Value Date   WBC 4.5 10/08/2024   NEUTROABS 2.7 10/08/2024   HGB 13.3 10/08/2024   HCT 39.2 10/08/2024   MCV 109.2 (H) 10/08/2024   PLT 246 10/08/2024   Lab Results  Component Value Date   IRON 57 12/10/2023   TIBC 398 12/10/2023   IRONPCTSAT 14 12/10/2023   Lab Results  Component Value Date   FERRITIN 20 12/10/2023     STUDIES: MM 3D SCREENING MAMMOGRAM BILATERAL BREAST Result Date: 09/28/2024 CLINICAL DATA:  Screening. EXAM: DIGITAL SCREENING BILATERAL MAMMOGRAM WITH TOMOSYNTHESIS AND CAD TECHNIQUE: Bilateral screening digital craniocaudal and mediolateral oblique mammograms were obtained. Bilateral screening digital breast tomosynthesis was performed. The images were evaluated with computer-aided detection. COMPARISON:  Previous exam(s). ACR Breast Density Category b: There are scattered areas of fibroglandular density. FINDINGS: There are no findings suspicious for malignancy. IMPRESSION: No mammographic evidence of malignancy. A result letter of this screening mammogram will be mailed directly to the patient. RECOMMENDATION: Screening mammogram in  one year. (Code:SM-B-01Y) BI-RADS CATEGORY  1: Negative. Electronically Signed   By: Corean Salter M.D.   On: 09/28/2024 12:23     ASSESSMENT: JAK2 positive essential thrombocytosis.  PLAN:    JAK2 positive essential thrombocytosis: Patient initiated 500 mg Hydrea  daily in April 2025.  Platelet count continues to be within normal limits.  Previously, all of her laboratory work is either negative or within normal limits.  [employer] myeloid also noted the JAK2 mutation.  No further intervention is needed.  Patient does not require bone marrow biopsy.  Continue 500 mg Hydrea  daily as prescribed.  Return to clinic in 3 months for laboratory work only and in 6 months for laboratory work and further evaluation. Macrocytosis: Likely secondary to Hydrea . Diarrhea: Intermittent.  Unrelated to Hydrea .  Continue  Imodium OTC as needed.   Patient expressed understanding and was in agreement with this plan. She also understands that She can call clinic at any time with any questions, concerns, or complaints.     Evalene JINNY Reusing, MD   10/08/2024 11:16 AM     "

## 2025-01-04 ENCOUNTER — Inpatient Hospital Stay

## 2025-01-07 ENCOUNTER — Ambulatory Visit: Admitting: Internal Medicine

## 2025-04-13 ENCOUNTER — Inpatient Hospital Stay

## 2025-04-13 ENCOUNTER — Inpatient Hospital Stay: Admitting: Oncology
